# Patient Record
Sex: Female | Born: 1990 | Hispanic: Yes | Marital: Single | Smoking: Never smoker
Health system: Southern US, Community
[De-identification: ages and names within clinical notes are randomized; demographics above are authoritative.]

## PROBLEM LIST (undated history)

## (undated) ENCOUNTER — Inpatient Hospital Stay (HOSPITAL_COMMUNITY): Payer: Self-pay

## (undated) DIAGNOSIS — R51 Headache: Secondary | ICD-10-CM

## (undated) DIAGNOSIS — R519 Headache, unspecified: Secondary | ICD-10-CM

## (undated) HISTORY — DX: Headache, unspecified: R51.9

## (undated) HISTORY — PX: NO PAST SURGERIES: SHX2092

## (undated) HISTORY — DX: Headache: R51

---

## 2012-12-17 ENCOUNTER — Ambulatory Visit: Payer: Self-pay | Admitting: Physician Assistant

## 2012-12-17 VITALS — BP 111/67 | HR 101 | Temp 98.9°F | Resp 17 | Ht 64.5 in | Wt 112.0 lb

## 2012-12-17 DIAGNOSIS — R05 Cough: Secondary | ICD-10-CM

## 2012-12-17 DIAGNOSIS — J029 Acute pharyngitis, unspecified: Secondary | ICD-10-CM

## 2012-12-17 LAB — POCT RAPID STREP A (OFFICE): Rapid Strep A Screen: NEGATIVE

## 2012-12-17 MED ORDER — FIRST-DUKES MOUTHWASH MT SUSP: 10.0000 mL | OROMUCOSAL | Status: DC | PRN

## 2012-12-17 MED ORDER — BENZONATATE 100 MG PO CAPS: 100.0000 mg | ORAL_CAPSULE | Freq: Three times a day (TID) | ORAL | Status: DC | PRN

## 2012-12-17 NOTE — Patient Instructions (Signed)
Begin using the cough medicine three times per day.  Use the magic mouth wash every 2 hour as needed for sore throat.  You can take 600mg  ibuprofen every 8 hours for throat pain as well.  Use an over the counter allergy medicine (Zyrtec, Claritin, or Allegra) to help with runny nose.  Rest your voice.  Plenty of fluids.  If you are worsening or not improving, please let us know.   Viral Pharyngitis Viral pharyngitis is a viral infection that produces redness, pain, and swelling (inflammation) of the throat. It can spread from person to person (contagious). CAUSES Viral pharyngitis is caused by inhaling a large amount of certain germs called viruses. Many different viruses cause viral pharyngitis. SYMPTOMS Symptoms of viral pharyngitis include:  Sore throat.  Tiredness.  Stuffy nose.  Low-grade fever.  Congestion.  Cough. TREATMENT Treatment includes rest, drinking plenty of fluids, and the use of over-the-counter medication (approved by your caregiver). HOME CARE INSTRUCTIONS   Drink enough fluids to keep your urine clear or pale yellow.  Eat soft, cold foods such as ice cream, frozen ice pops, or gelatin dessert.  Gargle with warm salt water (1 tsp salt per 1 qt of water).  If over age 23, throat lozenges may be used safely.  Only take over-the-counter or prescription medicines for pain, discomfort, or fever as directed by your caregiver. Do not take aspirin. To help prevent spreading viral pharyngitis to others, avoid:  Mouth-to-mouth contact with others.  Sharing utensils for eating and drinking.  Coughing around others. SEEK MEDICAL CARE IF:   You are better in a few days, then become worse.  You have a fever or pain not helped by pain medicines.  There are any other changes that concern you. Document Released: 05/13/2005 Document Revised: 10/26/2011 Document Reviewed: 10/09/2010 [facility] Patient Information 2013 [place], [place].

## 2012-12-17 NOTE — Progress Notes (Signed)
  Subjective:    Patient ID: Andrea Caldwell, female    DOB: Jan 27, 1991, 22 y.o.   MRN: [id number]  HPI   Andrea Caldwell is a 22 yr old female here with concern for illness.  Pt does not speak english, a family member is assisting with the history.  Pt states she has had approx 5 days of sore throat.  She has lost her voice.  Additionally has some nasal drainage and non-productive cough.  Pt is coughing frequently, her back hurts when she coughs.  No ear pain, no GI symptoms, no fever.  Has been using Advil for symptoms.  Brother is sick with similar symptoms.     Review of Systems  Constitutional: Negative for fever and chills.  HENT: Positive for congestion, sore throat, rhinorrhea and voice change (hoarse). Negative for ear pain, neck pain and neck stiffness.   Respiratory: Positive for cough. Negative for shortness of breath and wheezing.   Cardiovascular: Negative.   Gastrointestinal: Negative.   Musculoskeletal: Positive for back pain (with cough).  Skin: Negative.   Neurological: Negative.        Objective:   Physical Exam  Vitals reviewed. Constitutional: She is oriented to person, place, and time. She appears well-developed and well-nourished. No distress.  HENT:  Head: Normocephalic and atraumatic.  Right Ear: Tympanic membrane and ear canal normal.  Left Ear: Tympanic membrane and ear canal normal.  Nose: Nose normal.  Mouth/Throat: Uvula is midline and mucous membranes are normal. Posterior oropharyngeal erythema present. No oropharyngeal exudate, posterior oropharyngeal edema or tonsillar abscesses.  Eyes: Conjunctivae are normal. No scleral icterus.  Neck: Neck supple.  Cardiovascular: Normal rate, regular rhythm and normal heart sounds.  Exam reveals no gallop and no friction rub.   No murmur heard. Pulmonary/Chest: Effort normal and breath sounds normal. She has no wheezes. She has no rales.  Abdominal: Soft. There is no tenderness.  Lymphadenopathy:    She has no cervical  adenopathy.  Neurological: She is alert and oriented to person, place, and time.  Skin: Skin is warm and dry.  Psychiatric: She has a normal mood and affect. Her behavior is normal.     Filed Vitals:   12/17/12 0840  BP: 111/67  Pulse: 101  Temp: 98.9 F (37.2 C)  Resp: 17     Results for orders placed in visit on 12/17/12  POCT RAPID STREP A (OFFICE)      Result Value Range   Rapid Strep A Screen Negative  Negative        Assessment & Plan:  Acute pharyngitis - Plan: POCT rapid strep A, Diphenhyd-Hydrocort-Nystatin (FIRST-DUKES MOUTHWASH) SUSP  Cough - Plan: benzonatate (TESSALON) 100 MG capsule   Andrea Caldwell is a 22 yr old female with pharyngitis and cough.  Suspect viral etiology.  Rapid strep is negative.  Exam is reassuring.  Tessalon TID for cough.  Magic mouthwash and ibuprofen for sore throat.  Encouraged voice rest to help with laryngitis.  OTC antihistamine for nasal symptoms.  Push fluids.  Rest.  If any symptoms worsening or not improving, pt will RTC.

## 2015-08-01 ENCOUNTER — Ambulatory Visit (INDEPENDENT_AMBULATORY_CARE_PROVIDER_SITE_OTHER): Payer: Self-pay | Admitting: Physician Assistant

## 2015-08-01 VITALS — BP 120/60 | HR 79 | Temp 98.8°F | Resp 18 | Ht 64.0 in | Wt 130.4 lb

## 2015-08-01 DIAGNOSIS — N912 Amenorrhea, unspecified: Secondary | ICD-10-CM

## 2015-08-01 DIAGNOSIS — Z23 Encounter for immunization: Secondary | ICD-10-CM

## 2015-08-01 DIAGNOSIS — Z3201 Encounter for pregnancy test, result positive: Secondary | ICD-10-CM

## 2015-08-01 LAB — POCT URINE PREGNANCY: Preg Test, Ur: POSITIVE — AB

## 2015-08-01 NOTE — Patient Instructions (Signed)
Visit [email or web address]  cido flico en el embarazo (Folic Acid in Pregnancy) El cido flico es una vitamina B que ayuda a prevenir la anomala congnita del tubo neural. El tubo neural es la parte de un feto en desarrollo que se convierte en el cerebro y la mdula espinal. Cuando el tubo neural no se cierra como corresponde, el beb nace con un defecto del tubo neural. Los defectos del tubo neural incluyen espina bfida, hernia de la mdula espinal y la ausencia de una parte o de todo el cerebro (anencefalia).  Tome cido flico al menos 4semanas antes de quedar embarazada y Energy Transfer Partnersdurante los primeros 3meses de Creswellembarazo, que es cuando se desarrolla el tubo neural. El cido flico est disponible en la mayora de las multivitaminas o como suplemento con cido flico solamente, y tambin lo contienen algunos alimentos. El consumo de la cantidad [place] May[place] Islandsadecuada de cido flico antes de la concepcin y durante el embarazo disminuye las probabilidades de Warehouse managertener un beb con un defecto del tubo neural. El hecho de tomar cido flico no influir en un defecto del tubo neural en el caso de que ya se haya desarrollado. DIAGNSTICO   Si el feto tiene un defecto del tubo neural, un anlisis de alfa fetoprotena (AFP) en la sangre o de lquido amnitico mostrar niveles altos de alfa fetoprotena. Este anlisis se hace a todas las Tax inspectorembarazadas en el primer trimestre.  Una ecografa puede detectar un defecto del tubo neural. LO QUE USTED PUEDE HACER:  Tome una multivitamina con al Lowe's Companiesmenos 0,354miligramos (400microgramos) de cido flico CarMaxtodos los das, al menos 4semanas antes de quedar embarazada y [place] Parkdurante las primeras 12semanas de Psychiatristembarazo.  Si ya ha tenido un beb con un defecto del tubo neural, tome 4miligramos (4000microgramos) de cido flico CarMaxtodos los das. Tome esta cantidad desde 1mes antes de intentar quedar [place]embarazada y [address] A[address] tomndola durante los primeros 3meses de [place] Villageembarazo. Si tiene trastorno convulsivo o toma  medicamentos para controlar las convulsiones, infrmeselo a su obstetra. Siga tomando el cido flico, excepto si le indican lo contrario.  CIDO FLICO EN LOS ALIMENTOS Siga una dieta saludable con alimentos que contengan cido flico, la forma natural de la vitamina. Algunos de estos alimentos son:  Cereales fortificados para el desayuno.  Lentejas.  Esprragos.  Espinaca.  Vsceras (hgado).  Frijoles negros.  Man (solo si no es Counselling psychologistalrgica).  Brcoli.  Aura CampsFresas, naranjas.  Jugo de naranja (es mejor el jugo concentrado).  Pastas y panes enriquecidos.  Cathleen FearsLechuga romana. CONSULTE A SU MDICO EN LOS SIGUIENTES CASOS:  Est en el primer trimestre de embarazo y tiene niveles altos de azcar en la sangre.  Est en el primer trimestre de embarazo y tiene fiebre alta. En casi todos los casos, un feto en el que se detecta un defecto del tubo neural necesitar atencin especializada, que puede no estar disponible en todos los hospitales. Hable con su mdico sobre lo que es mejor para usted y su beb.   Esta informacin no tiene Theme park managercomo fin reemplazar el consejo del mdico. Asegrese de hacerle al mdico cualquier pregunta que tenga.   Document Released: 05/24/2013 Document Revised: 08/24/2014 Elsevier Interactive Patient Education [employer]2016 Elsevier Inc.

## 2015-08-01 NOTE — Progress Notes (Signed)
   08/01/2015 6:12 PM   DOB: Sep 05, 1990 / MRN: [id number]  SUBJECTIVE:  Andrea BattyLaura Caldwell is a 24 y.o. female presenting for a "blood pregnancy test," at the advise of her sister.  Patient reports that she is two months later and has taken one OTC preg test which was positive.  She feels well tonight and has no physical complaints.    She has No Known Allergies.   She  has no past medical history on file.    She  reports that she has never smoked. She does not have any smokeless tobacco history on file. She  has no sexual activity history on file. The patient  has no past surgical history on file.  Her family history includes Cancer in her mother; Diabetes in her maternal grandmother; Heart disease in her father; Hyperlipidemia in her father.  Review of Systems  Constitutional: Negative for fever and chills.  Eyes: Negative for blurred vision.  Respiratory: Negative for cough and shortness of breath.   Cardiovascular: Negative for chest pain.  Gastrointestinal: Negative for nausea and abdominal pain.  Genitourinary: Negative for dysuria, urgency and frequency.  Musculoskeletal: Negative for myalgias.  Skin: Negative for rash.  Neurological: Negative for dizziness, tingling and headaches.  Psychiatric/Behavioral: Negative for depression. The patient is not nervous/anxious.     Problem list and medications reviewed and updated by myself where necessary, and exist elsewhere in the encounter.   OBJECTIVE:  BP 120/60 mmHg  Pulse 79  Temp(Src) 98.8 F (37.1 C) (Oral)  Resp 18  Ht 5\' 4"  (1.626 m)  Wt 130 lb 6.4 oz (59.149 kg)  BMI 22.37 kg/m2  SpO2 99%  LMP 06/14/2015 CrCl cannot be calculated (Patient has no serum creatinine result on file.).  Physical Exam  Constitutional: She is oriented to person, place, and time. She appears well-nourished. No distress.  Eyes: EOM are normal. Pupils are equal, round, and reactive to light.  Cardiovascular: Normal rate.   Pulmonary/Chest: Effort  normal.  Abdominal: She exhibits no distension.  Neurological: She is alert and oriented to person, place, and time. No cranial nerve deficit. Gait normal.  Skin: Skin is dry. She is not diaphoretic.  Psychiatric: She has a normal mood and affect.  Vitals reviewed.   Results for orders placed or performed in visit on 08/01/15 (from the past 48 hour(s))  POCT urine pregnancy     Status: Abnormal   Collection Time: 08/01/15  6:10 PM  Result Value Ref Range   Preg Test, Ur Positive (A) Negative    ASSESSMENT AND PLAN  Vernona RiegerLaura was seen today for other.  Diagnoses and all orders for this visit:  Amenorrhea: Pregnancy test positive with patient due on AUGUST 4TH 2017.  Will refer to OB/GYN for follow up in roughly 1 month.  Flu shot provided. Advised that she may only take tylenol for pain.  Advised that she consult the pharmacist for taking a prenatal and with regard to any other medication.   -     POCT urine pregnancy   The patient was advised to call or return to clinic if she does not see an improvement in symptoms or to seek the care of the closest emergency department if she worsens with the above plan.   Deliah BostonMichael Kein Carlberg, MHS, PA-C [facility] 08/01/2015 6:12 PM

## 2015-08-18 NOTE — L&D Delivery Note (Signed)
  Delivery Note At 2248 a viable malesex was delivered via  (Presentation:R ;OA  ).  APGAR: 9,9 ; weight - pending.  Placenta status: spontaneous ,intact . Delayed cord clamping  Cord: 3 vessel with the following complications: none .  Cord pH: n/a  Anesthesia: Epidural  Episiotomy: none Lacerations:  1st degree perineal Suture Repair: 3.0 monocryl Est. Blood Loss (mL):  350cc  Mom to postpartum.  Baby to Couplet care / Skin to Skin.  Palma HolterKanishka G Gunadasa, MD 02/27/2016, 11:01 PM  I was here for the above and performed the repair.  CRESENZO-DISHMAN,Breely Panik

## 2015-09-17 LAB — OB RESULTS CONSOLE HGB/HCT, BLOOD
HEMATOCRIT: 39 %
HEMOGLOBIN: 13.1 g/dL

## 2015-09-17 LAB — OB RESULTS CONSOLE ANTIBODY SCREEN: ANTIBODY SCREEN: NEGATIVE

## 2015-09-17 LAB — OB RESULTS CONSOLE GC/CHLAMYDIA
CHLAMYDIA, DNA PROBE: NEGATIVE
GC PROBE AMP, GENITAL: NEGATIVE

## 2015-09-17 LAB — OB RESULTS CONSOLE RUBELLA ANTIBODY, IGM: RUBELLA: IMMUNE

## 2015-09-17 LAB — OB RESULTS CONSOLE HEPATITIS B SURFACE ANTIGEN: Hepatitis B Surface Ag: NEGATIVE

## 2015-09-17 LAB — OB RESULTS CONSOLE ABO/RH: RH Type: POSITIVE

## 2015-09-17 LAB — OB RESULTS CONSOLE HIV ANTIBODY (ROUTINE TESTING): HIV: NONREACTIVE

## 2015-09-17 LAB — OB RESULTS CONSOLE PLATELET COUNT: PLATELETS: 320 10*3/uL

## 2015-12-13 LAB — OB RESULTS CONSOLE RPR: RPR: NONREACTIVE

## 2016-02-19 ENCOUNTER — Encounter: Payer: Self-pay | Admitting: Obstetrics

## 2016-02-21 ENCOUNTER — Encounter: Payer: Self-pay | Admitting: *Deleted

## 2016-02-24 ENCOUNTER — Ambulatory Visit (INDEPENDENT_AMBULATORY_CARE_PROVIDER_SITE_OTHER): Payer: Self-pay | Admitting: Obstetrics & Gynecology

## 2016-02-24 VITALS — BP 132/80 | HR 93 | Wt 157.0 lb

## 2016-02-24 DIAGNOSIS — Z3490 Encounter for supervision of normal pregnancy, unspecified, unspecified trimester: Secondary | ICD-10-CM | POA: Insufficient documentation

## 2016-02-24 DIAGNOSIS — Z3493 Encounter for supervision of normal pregnancy, unspecified, third trimester: Secondary | ICD-10-CM

## 2016-02-24 NOTE — Patient Instructions (Signed)
Regrese a la clinica cuando tenga su cita. Si tiene problemas o preguntas, llama a la clinica o vaya a la sala de emergencia al [facility].    

## 2016-02-24 NOTE — Progress Notes (Signed)
Needs 36 wk cultures

## 2016-02-24 NOTE — Progress Notes (Signed)
Subjective:  Andrea Caldwell is a [age] y[age] Cal[age].o. G1P0 at 3515w3d being seen today for ongoing prenatal care; was Dr. Elsie Caldwell's patient.  She is currently monitored for the following issues for this low-risk pregnancy and has Supervision of normal pregnancy on her problem list.  Patient reports no complaints.  Contractions: Not present. Vag. Bleeding: None.  Movement: Present. Denies leaking of fluid.   The following portions of the patient's history were reviewed and updated as appropriate: allergies, current medications, past family history, past medical history, past social history, past surgical history and problem list. Problem list updated.  Objective:   Filed Vitals:   02/24/16 0808  BP: 132/80  Pulse: 93  Weight: 157 lb (71.215 kg)    Fetal Status: Fetal Heart Rate (bpm): 140 Fundal Height: 37 cm Movement: Present  Presentation: Vertex  General:  Alert, oriented and cooperative. Patient is in no acute distress.  Skin: Skin is warm and dry. No rash noted.   Cardiovascular: Normal heart rate noted  Respiratory: Normal respiratory effort, no problems with respiration noted  Abdomen: Soft, gravid, appropriate for gestational age. Pain/Pressure: Present     Pelvic:  Cervical exam performed Dilation: 1 Effacement (%): 70 Station: -2  Extremities: Normal range of motion.  Edema: Trace  Mental Status: Normal mood and affect. Normal behavior. Normal judgment and thought content.   Urinalysis: Urine Protein: Negative Urine Glucose: Negative  Assessment and Plan:  Pregnancy: G1P0 at 1315w3d  Supervision of normal pregnancy, third trimester Pelvic cultures done today. - Strep Gp B NAA - GC/Chlamydia Probe Amp Preterm labor symptoms and general obstetric precautions including but not limited to vaginal bleeding, contractions, leaking of fluid and fetal movement were reviewed in detail with the patient. Please refer to After Visit Summary for other counseling recommendations.  Return in  about 1 week (around 03/02/2016) for OB Visit.   Andrea NewcomerUgonna A Anyanwu, MD

## 2016-02-25 ENCOUNTER — Other Ambulatory Visit: Payer: Self-pay

## 2016-02-25 ENCOUNTER — Telehealth: Payer: Self-pay | Admitting: Obstetrics

## 2016-02-25 DIAGNOSIS — Z113 Encounter for screening for infections with a predominantly sexual mode of transmission: Secondary | ICD-10-CM

## 2016-02-25 NOTE — Telephone Encounter (Signed)
Pt will came to [facility] to collect urine @12 :00 on 02/25/16 [facility] needs to collect pt's urine again due a [facility] accident/ Urine leaked in transit.   Andrea Caldwell

## 2016-02-26 ENCOUNTER — Inpatient Hospital Stay (HOSPITAL_COMMUNITY)
Admission: AD | Admit: 2016-02-26 | Discharge: 2016-02-29 | DRG: 775 | Disposition: A | Discharge status: Home / Self Care | Payer: Medicaid Other | Source: Ambulatory Visit | Attending: Obstetrics & Gynecology | Admitting: Obstetrics & Gynecology

## 2016-02-26 ENCOUNTER — Encounter (HOSPITAL_COMMUNITY): Payer: Self-pay

## 2016-02-26 DIAGNOSIS — O99824 Streptococcus B carrier state complicating childbirth: Secondary | ICD-10-CM | POA: Diagnosis present

## 2016-02-26 DIAGNOSIS — IMO0001 Reserved for inherently not codable concepts without codable children: Secondary | ICD-10-CM

## 2016-02-26 DIAGNOSIS — Z3493 Encounter for supervision of normal pregnancy, unspecified, third trimester: Secondary | ICD-10-CM

## 2016-02-26 DIAGNOSIS — O42013 Preterm premature rupture of membranes, onset of labor within 24 hours of rupture, third trimester: Principal | ICD-10-CM | POA: Diagnosis present

## 2016-02-26 DIAGNOSIS — Z3A36 36 weeks gestation of pregnancy: Secondary | ICD-10-CM | POA: Diagnosis not present

## 2016-02-26 DIAGNOSIS — O42919 Preterm premature rupture of membranes, unspecified as to length of time between rupture and onset of labor, unspecified trimester: Secondary | ICD-10-CM

## 2016-02-26 LAB — CBC
HCT: 39.1 % (ref 36.0–46.0)
Hemoglobin: 13.4 g/dL (ref 12.0–15.0)
MCH: 28.8 pg (ref 26.0–34.0)
MCHC: 34.3 g/dL (ref 30.0–36.0)
MCV: 83.9 fL (ref 78.0–100.0)
PLATELETS: 217 10*3/uL (ref 150–400)
RBC: 4.66 MIL/uL (ref 3.87–5.11)
RDW: 13.9 % (ref 11.5–15.5)
WBC: 11.2 10*3/uL — AB (ref 4.0–10.5)

## 2016-02-26 LAB — TYPE AND SCREEN
ABO/RH(D): A POS
ANTIBODY SCREEN: NEGATIVE

## 2016-02-26 LAB — STREP GP B NAA: STREP GROUP B AG: POSITIVE — AB

## 2016-02-26 LAB — ABO/RH: ABO/RH(D): A POS

## 2016-02-26 MED ORDER — OXYTOCIN 40 UNITS IN LACTATED RINGERS INFUSION - SIMPLE MED: 2.5000 [IU]/h | INTRAVENOUS | Status: DC

## 2016-02-26 MED ORDER — OXYCODONE-ACETAMINOPHEN 5-325 MG PO TABS: 2.0000 | ORAL_TABLET | ORAL | Status: DC | PRN

## 2016-02-26 MED ORDER — ONDANSETRON HCL 4 MG/2ML IJ SOLN: 4.0000 mg | Freq: Four times a day (QID) | INTRAMUSCULAR | Status: DC | PRN

## 2016-02-26 MED ORDER — PENICILLIN G POTASSIUM 5000000 UNITS IJ SOLR
2.5000 10*6.[IU] | INTRAMUSCULAR | Status: DC
  Administered 2016-02-26 – 2016-02-27 (×7): 2.5 10*6.[IU] via INTRAVENOUS
  Filled 2016-02-26 (×15): qty 2.5

## 2016-02-26 MED ORDER — ACETAMINOPHEN 325 MG PO TABS: 650.0000 mg | ORAL_TABLET | ORAL | Status: DC | PRN

## 2016-02-26 MED ORDER — OXYTOCIN BOLUS FROM INFUSION
500.0000 mL | INTRAVENOUS | Status: DC
  Administered 2016-02-27: 500 mL via INTRAVENOUS

## 2016-02-26 MED ORDER — FLEET ENEMA 7-19 GM/118ML RE ENEM: 1.0000 | ENEMA | RECTAL | Status: DC | PRN

## 2016-02-26 MED ORDER — LIDOCAINE HCL (PF) 1 % IJ SOLN
30.0000 mL | INTRAMUSCULAR | Status: DC | PRN
  Filled 2016-02-26: qty 30

## 2016-02-26 MED ORDER — SOD CITRATE-CITRIC ACID 500-334 MG/5ML PO SOLN: 30.0000 mL | ORAL | Status: DC | PRN

## 2016-02-26 MED ORDER — OXYCODONE-ACETAMINOPHEN 5-325 MG PO TABS: 1.0000 | ORAL_TABLET | ORAL | Status: DC | PRN

## 2016-02-26 MED ORDER — LACTATED RINGERS IV SOLN: 500.0000 mL | INTRAVENOUS | Status: DC | PRN

## 2016-02-26 MED ORDER — BETAMETHASONE SOD PHOS & ACET 6 (3-3) MG/ML IJ SUSP
12.0000 mg | Freq: Once | INTRAMUSCULAR | Status: AC
  Administered 2016-02-26: 12 mg via INTRAMUSCULAR
  Filled 2016-02-26: qty 2

## 2016-02-26 MED ORDER — PENICILLIN G POTASSIUM 5000000 UNITS IJ SOLR
5.0000 10*6.[IU] | Freq: Once | INTRAVENOUS | Status: AC
  Administered 2016-02-26: 5 10*6.[IU] via INTRAVENOUS
  Filled 2016-02-26: qty 5

## 2016-02-26 MED ORDER — LACTATED RINGERS IV SOLN
INTRAVENOUS | Status: DC
  Administered 2016-02-26 – 2016-02-27 (×4): via INTRAVENOUS

## 2016-02-26 NOTE — H&P (Signed)
Andrea FillerLaura Caldwell is a 25 y.o. female presenting for leaking of fluid at home.  Has a few mild contractions.  Has been followed by Dr Gaynell FaceMarshall.  Pregnancy has been uneventful.  RN Note: Patient presents with PROM at 1300. Patient states she had some brown discharge then a gush of fluid that was clear and has been leaking ever since. Fetus active.   Maternal Medical History:  Reason for admission: Rupture of membranes.  Nausea.  Contractions: Onset was 3-5 hours ago.   Frequency: irregular.   Perceived severity is mild.    Fetal activity: Perceived fetal activity is normal.   Last perceived fetal movement was within the past hour.    Prenatal complications: No bleeding, PIH, infection or preterm labor.   Prenatal Complications - Diabetes: none.    OB History    Gravida Para Term Preterm AB TAB SAB Ectopic Multiple Living   1              History reviewed. No pertinent past medical history. History reviewed. No pertinent past surgical history. Family History: family history includes Cancer in her mother; Diabetes in her maternal grandmother; Heart disease in her father; Hyperlipidemia in her father. Social History:  reports that she has never smoked. She does not have any smokeless tobacco history on file. Her alcohol and drug histories are not on file.   Prenatal Transfer Tool  Maternal Diabetes: No Genetic Screening: Normal Maternal Ultrasounds/Referrals: Normal Fetal Ultrasounds or other Referrals:  None Maternal Substance Abuse:  No Significant Maternal Medications:  None Significant Maternal Lab Results:  Lab values include: Group B Strep positive Other Comments:  None  Review of Systems  Constitutional: Negative for fever, chills and malaise/fatigue.  Eyes: Negative for blurred vision.  Gastrointestinal: Negative for nausea, vomiting, abdominal pain, diarrhea and constipation.  Genitourinary:       Leaking fluid  Neurological: Negative for dizziness.     Dilation: 1 Effacement (%): Thick Station: -3 Exam by:: Kayren EavesAshley Garvey RN  Blood pressure 136/86, pulse 91, temperature 99.1 F (37.3 C), temperature source Oral, resp. rate 18, last menstrual period 06/14/2015. Maternal Exam:  Uterine Assessment: Contraction strength is mild.  Contraction frequency is irregular.   Abdomen: Patient reports no abdominal tenderness. Fundal height is 36.   Estimated fetal weight is 6.   Fetal presentation: vertex  Introitus: Normal vulva. Vagina is positive for vaginal discharge.  Ferning test: positive.  Nitrazine test: not done. Amniotic fluid character: clear.  Pelvis: adequate for delivery.   Cervix: Cervix evaluated by digital exam.     Fetal Exam Fetal Monitor Review: Mode: ultrasound.   Baseline rate: 135.  Variability: moderate (6-25 bpm).   Pattern: accelerations present and no decelerations.    Fetal State Assessment: Category I - tracings are normal.     Physical Exam  Constitutional: She is oriented to person, place, and time. She appears well-developed and well-nourished. No distress.  HENT:  Head: Normocephalic.  Cardiovascular: Normal rate and regular rhythm.   Respiratory: Effort normal. No respiratory distress.  GI: Soft. She exhibits no distension. There is no tenderness. There is no rebound and no guarding.  Genitourinary: Vaginal discharge found.  Dilation: 1 Effacement (%): Thick Cervical Position: Posterior Station: -3 Exam by:: Kayren EavesAshley Garvey RN   Bedside US done by me for presentation Vertex confirmed   Musculoskeletal: Normal range of motion.  Neurological: She is alert and oriented to person, place, and time.  Skin: Skin is warm and dry.  Psychiatric: She  has a normal mood and affect.    Prenatal labs: ABO, Rh: A/Positive/-- (01/31 0000) Antibody: Negative (01/31 0000) Rubella: Immune (01/31 0000) RPR: Nonreactive (04/28 0000)  HBsAg: Negative (01/31 0000)  HIV: Non-reactive (01/31 0000)  GBS:  Positive (07/10 1630)   Assessment/Plan: SIUP at [redacted]w[redacted]d  PPROM Not in labor yet GBS positive  Ad[facility] to [facility] when bed available Routine orders Labor augmentation when able Penicillin prophylaxis   [facility] 02/26/2016, 5:56 PM

## 2016-02-26 NOTE — MAU Note (Signed)
Patient presents with PROM at 1300. Patient states she had some brown discharge then a gush of fluid that was clear and has been leaking ever since. Fetus active.

## 2016-02-26 NOTE — Progress Notes (Signed)
Updated Dr Despina HiddenEure.  Filed Vitals:   02/26/16 1624  BP: 136/86  Pulse: 91  Temp: 99.1 F (37.3 C)  TempSrc: Oral  Resp: 18    Still awaiting bed on Labor and Delivery.  FHR reassuring, monitoring intermittently   Dilation: 2 Effacement (%): 90 Cervical Position: Middle Station: -2 Presentation: Vertex Exam by:: Yoan Sallade CNM  Foley bulb inserted to begin the augmentation process.  Will continue to observe

## 2016-02-27 ENCOUNTER — Inpatient Hospital Stay (HOSPITAL_COMMUNITY): Payer: Medicaid Other | Admitting: Anesthesiology

## 2016-02-27 ENCOUNTER — Encounter (HOSPITAL_COMMUNITY): Payer: Self-pay | Admitting: *Deleted

## 2016-02-27 DIAGNOSIS — O42013 Preterm premature rupture of membranes, onset of labor within 24 hours of rupture, third trimester: Secondary | ICD-10-CM

## 2016-02-27 DIAGNOSIS — Z3A36 36 weeks gestation of pregnancy: Secondary | ICD-10-CM

## 2016-02-27 LAB — GC/CHLAMYDIA PROBE AMP
CHLAMYDIA, DNA PROBE: NEGATIVE
Chlamydia trachomatis, NAA: NEGATIVE
NEISSERIA GONORRHOEAE BY PCR: NEGATIVE
NEISSERIA GONORRHOEAE BY PCR: NEGATIVE

## 2016-02-27 LAB — RPR: RPR Ser Ql: NONREACTIVE

## 2016-02-27 LAB — OB RESULTS CONSOLE GBS: GBS: POSITIVE

## 2016-02-27 MED ORDER — LACTATED RINGERS IV SOLN: 500.0000 mL | Freq: Once | INTRAVENOUS | Status: DC

## 2016-02-27 MED ORDER — DIPHENHYDRAMINE HCL 50 MG/ML IJ SOLN: 12.5000 mg | INTRAMUSCULAR | Status: DC | PRN

## 2016-02-27 MED ORDER — TERBUTALINE SULFATE 1 MG/ML IJ SOLN
0.2500 mg | Freq: Once | INTRAMUSCULAR | Status: DC | PRN
  Filled 2016-02-27: qty 1

## 2016-02-27 MED ORDER — PHENYLEPHRINE 40 MCG/ML (10ML) SYRINGE FOR IV PUSH (FOR BLOOD PRESSURE SUPPORT)
80.0000 ug | PREFILLED_SYRINGE | INTRAVENOUS | Status: DC | PRN
  Filled 2016-02-27: qty 5

## 2016-02-27 MED ORDER — EPHEDRINE 5 MG/ML INJ
10.0000 mg | INTRAVENOUS | Status: DC | PRN
  Filled 2016-02-27: qty 2

## 2016-02-27 MED ORDER — FENTANYL 2.5 MCG/ML BUPIVACAINE 1/10 % EPIDURAL INFUSION (WH - ANES)
14.0000 mL/h | INTRAMUSCULAR | Status: DC | PRN
  Administered 2016-02-27: 14 mL/h via EPIDURAL
  Filled 2016-02-27: qty 125

## 2016-02-27 MED ORDER — BETAMETHASONE SOD PHOS & ACET 6 (3-3) MG/ML IJ SUSP
12.0000 mg | Freq: Once | INTRAMUSCULAR | Status: AC
  Administered 2016-02-27: 12 mg via INTRAMUSCULAR
  Filled 2016-02-27: qty 2

## 2016-02-27 MED ORDER — LIDOCAINE HCL (PF) 1 % IJ SOLN
INTRAMUSCULAR | Status: DC | PRN
  Administered 2016-02-27 (×2): 7 mL via EPIDURAL

## 2016-02-27 MED ORDER — PHENYLEPHRINE 40 MCG/ML (10ML) SYRINGE FOR IV PUSH (FOR BLOOD PRESSURE SUPPORT)
80.0000 ug | PREFILLED_SYRINGE | INTRAVENOUS | Status: DC | PRN
  Filled 2016-02-27: qty 5
  Filled 2016-02-27: qty 10

## 2016-02-27 MED ORDER — OXYTOCIN 40 UNITS IN LACTATED RINGERS INFUSION - SIMPLE MED
1.0000 m[IU]/min | INTRAVENOUS | Status: DC
  Administered 2016-02-27: 2 m[IU]/min via INTRAVENOUS
  Filled 2016-02-27: qty 1000

## 2016-02-27 NOTE — Progress Notes (Signed)
Labor Progress Note Andrea FillerLaura Caldwell is a 25 y.o. G1P0 at 7025w6d presented for PPROM 1300 7/12 S:  Doing well. Pushing with nursing. Epidural O:  BP 135/77 mmHg  Pulse 100  Temp(Src) 98.8 F (37.1 C) (Oral)  Resp 18  Ht 5' 4.17" (1.63 m)  Wt 71.215 kg (157 lb)  BMI 26.80 kg/m2  SpO2 100%  LMP 06/14/2015 EFM: 145/ mod variability/ 15x15  CVE: Dilation: 10 Dilation Complete Date: 02/27/16 Dilation Complete Time: 2034 Effacement (%): 100 Cervical Position: Middle Station: +1 Presentation: Vertex Exam by:: Malva CoganA. Schwarz RN    A&P: 25 y.o. G1P0 6425w6d active labor. Pushing  #Labor: pushing  #Pain: epidural #FWB: Cat 1 #GBS pos >PCN  Palma HolterKanishka G Gunadasa, MD 10:05 PM

## 2016-02-27 NOTE — Progress Notes (Signed)
I was present during the Epidural procedure, by Orlan LeavensViria Alvarez Spanish Interpreter.

## 2016-02-27 NOTE — Progress Notes (Signed)
LABOR PROGRESS NOTE  Andrea FillerLaura Caldwell is a 25 y.o. G1P0 at 6120w6d  admitted for PPROM.  Subjective: Pt resting comfortably.   Objective: BP 104/56 mmHg  Pulse 115  Temp(Src) 98.2 F (36.8 C) (Oral)  Resp 16  Ht 5' 4.17" (1.63 m)  Wt 71.215 kg (157 lb)  BMI 26.80 kg/m2  SpO2 98%  LMP 06/14/2015 or  Filed Vitals:   02/27/16 0721 02/27/16 0813 02/27/16 0912 02/27/16 1002  BP:  118/67 114/58 104/56  Pulse: 136 112 108 115  Temp:  98.2 F (36.8 C)    TempSrc:  Oral    Resp:      Height:      Weight:      SpO2:        Dilation: 2 Effacement (%): 90 Cervical Position: Middle Station: -2 Presentation: Vertex Exam by:: Williams CNM  Labs: Lab Results  Component Value Date   WBC 11.2* 02/26/2016   HGB 13.4 02/26/2016   HCT 39.1 02/26/2016   MCV 83.9 02/26/2016   PLT 217 02/26/2016    Patient Active Problem List   Diagnosis Date Noted  . Active labor at term 02/26/2016  . Supervision of normal pregnancy 02/24/2016    Assessment / Plan: 25 y.o. G1P0 at 4720w6d here for PPROM.  Labor: s/p foley out at 0700 Fetal Wellbeing:  Cat 1 Pain Control:  Epidural when pt requests Anticipated MOD:  SVD  Loni MuseKate Erle Guster, MD 02/27/2016, 10:50 AM

## 2016-02-27 NOTE — Progress Notes (Addendum)
Andrea Caldwell is a 25 y.o. G1P0 at 6487w6d admitted for induction of labor due to pprom.  Subjective: Pt uncomfortable with cramping, desires to eat before Pitocin started.   Objective: BP 106/54 mmHg  Pulse 111  Temp(Src) 98.7 F (37.1 C) (Oral)  Resp 20  Ht 5' 4.17" (1.63 m)  Wt 157 lb (71.215 kg)  BMI 26.80 kg/m2  SpO2 98%  LMP 06/14/2015      FHT:  FHR: 135 bpm, variability: moderate,  accelerations:  Present,  decelerations:  Absent UC:   irregular, every 2-10 minutes SVE:   Dilation: 5 Effacement (%): 90 Station: -1 Exam by:: Leftwitch-Kirby, CNM  Labs: Lab Results  Component Value Date   WBC 11.2* 02/26/2016   HGB 13.4 02/26/2016   HCT 39.1 02/26/2016   MCV 83.9 02/26/2016   PLT 217 02/26/2016    Assessment / Plan: Augmentation of labor, progressing well  Labor: Progressing normally.  Plan for pt to eat light meal, then start Pitocin in 1 hour. Preeclampsia:  n/a Fetal Wellbeing:  Category I Pain Control:  Labor support without medications I/D:  n/a Anticipated MOD:  NSVD  LEFTWICH-KIRBY, Andrea Caldwell 02/27/2016, 12:59 PM

## 2016-02-27 NOTE — Anesthesia Procedure Notes (Signed)
Epidural Patient location during procedure: OB Start time: 02/27/2016 3:41 PM End time: 02/27/2016 3:45 PM  Staffing Anesthesiologist: Leilani AbleHATCHETT, Cordae Mccarey Performed by: anesthesiologist   Preanesthetic Checklist Completed: patient identified, surgical consent, pre-op evaluation, timeout performed, IV checked, risks and benefits discussed and monitors and equipment checked  Epidural Patient position: sitting Prep: site prepped and draped and DuraPrep Patient monitoring: continuous pulse ox and blood pressure Approach: midline Location: L3-L4 Injection technique: LOR air  Needle:  Needle type: Tuohy  Needle gauge: 17 G Needle length: 9 cm and 9 Needle insertion depth: 5 cm cm Catheter type: closed end flexible Catheter size: 19 Gauge Catheter at skin depth: 10 cm Test dose: negative and Other  Assessment Sensory level: T9 Events: blood not aspirated, injection not painful, no injection resistance, negative IV test and no paresthesia  Additional Notes Reason for block:procedure for pain

## 2016-02-27 NOTE — Anesthesia Preprocedure Evaluation (Signed)
Anesthesia Evaluation  Patient identified by MRN, date of birth, ID band Patient awake    Reviewed: Allergy & Precautions, H&P , NPO status , Patient's Chart, lab work & pertinent test results  Airway Mallampati: I  TM Distance: >3 FB Neck ROM: full    Dental no notable dental hx.    Pulmonary neg pulmonary ROS,    Pulmonary exam normal breath sounds clear to auscultation       Cardiovascular negative cardio ROS Normal cardiovascular exam     Neuro/Psych negative neurological ROS  negative psych ROS   GI/Hepatic negative GI ROS, Neg liver ROS,   Endo/Other  negative endocrine ROS  Renal/GU negative Renal ROS     Musculoskeletal   Abdominal Normal abdominal exam  (+)   Peds  Hematology negative hematology ROS (+)   Anesthesia Other Findings   Reproductive/Obstetrics (+) Pregnancy                             Anesthesia Physical Anesthesia Plan  ASA: II  Anesthesia Plan: Epidural   Post-op Pain Management:    Induction:   Airway Management Planned:   Additional Equipment:   Intra-op Plan:   Post-operative Plan:   Informed Consent: I have reviewed the patients History and Physical, chart, labs and discussed the procedure including the risks, benefits and alternatives for the proposed anesthesia with the patient or authorized representative who has indicated his/her understanding and acceptance.     Plan Discussed with:   Anesthesia Plan Comments:         Anesthesia Quick Evaluation  

## 2016-02-27 NOTE — Anesthesia Pain Management Evaluation Note (Signed)
  [facility] Pain Management Visit Note  Patient: Axel FillerLaura Garcia-Espitia, 25 y.o., female  "Hello [facility] am a [facility]. We have an anesthesia team available at all times to provide care throughout the hospital, including epidural management and anesthesia for C-section. I don't know your plan for the delivery whether it a natural birth, water birth, IV sedation, nitrous supplementation, doula or epidural, but we want to meet your pain goals."   1.Was your pain managed to your expectations on prior hospitalizations?   No prior hospitalizations  2.What is your expectation for pain management during this hospitalization?     Epidural  3.How can we help you reach that goal? epidural  Record the patient's initial score and the patient's pain goal.   Pain: 5  Pain Goal: 7 [facility] wants you to be able to say your pain was always managed very well.  Dierks Wach 02/27/2016

## 2016-02-28 ENCOUNTER — Encounter (HOSPITAL_COMMUNITY): Payer: Self-pay | Admitting: General Practice

## 2016-02-28 DIAGNOSIS — O99824 Streptococcus B carrier state complicating childbirth: Secondary | ICD-10-CM

## 2016-02-28 DIAGNOSIS — O42013 Preterm premature rupture of membranes, onset of labor within 24 hours of rupture, third trimester: Secondary | ICD-10-CM

## 2016-02-28 DIAGNOSIS — Z3A36 36 weeks gestation of pregnancy: Secondary | ICD-10-CM

## 2016-02-28 MED ORDER — ONDANSETRON HCL 4 MG/2ML IJ SOLN: 4.0000 mg | INTRAMUSCULAR | Status: DC | PRN

## 2016-02-28 MED ORDER — DIPHENHYDRAMINE HCL 25 MG PO CAPS: 25.0000 mg | ORAL_CAPSULE | Freq: Four times a day (QID) | ORAL | Status: DC | PRN

## 2016-02-28 MED ORDER — MEASLES, MUMPS & RUBELLA VAC ~~LOC~~ INJ
0.5000 mL | INJECTION | Freq: Once | SUBCUTANEOUS | Status: DC
  Filled 2016-02-28: qty 0.5

## 2016-02-28 MED ORDER — FLEET ENEMA 7-19 GM/118ML RE ENEM: 1.0000 | ENEMA | Freq: Every day | RECTAL | Status: DC | PRN

## 2016-02-28 MED ORDER — METHYLERGONOVINE MALEATE 0.2 MG/ML IJ SOLN: 0.2000 mg | INTRAMUSCULAR | Status: DC | PRN

## 2016-02-28 MED ORDER — BENZOCAINE-MENTHOL 20-0.5 % EX AERO
1.0000 "application " | INHALATION_SPRAY | CUTANEOUS | Status: DC | PRN
  Administered 2016-02-28: 1 via TOPICAL
  Filled 2016-02-28: qty 56

## 2016-02-28 MED ORDER — FERROUS SULFATE 325 (65 FE) MG PO TABS
325.0000 mg | ORAL_TABLET | Freq: Two times a day (BID) | ORAL | Status: DC
  Administered 2016-02-28 – 2016-02-29 (×3): 325 mg via ORAL
  Filled 2016-02-28 (×3): qty 1

## 2016-02-28 MED ORDER — IBUPROFEN 600 MG PO TABS
600.0000 mg | ORAL_TABLET | Freq: Four times a day (QID) | ORAL | Status: DC
  Administered 2016-02-28 – 2016-02-29 (×6): 600 mg via ORAL
  Filled 2016-02-28 (×6): qty 1

## 2016-02-28 MED ORDER — DIBUCAINE 1 % RE OINT: 1.0000 "application " | TOPICAL_OINTMENT | RECTAL | Status: DC | PRN

## 2016-02-28 MED ORDER — COCONUT OIL OIL: 1.0000 "application " | TOPICAL_OIL | Status: DC | PRN

## 2016-02-28 MED ORDER — TETANUS-DIPHTH-ACELL PERTUSSIS 5-2.5-18.5 LF-MCG/0.5 IM SUSP: 0.5000 mL | Freq: Once | INTRAMUSCULAR | Status: DC

## 2016-02-28 MED ORDER — ZOLPIDEM TARTRATE 5 MG PO TABS: 5.0000 mg | ORAL_TABLET | Freq: Every evening | ORAL | Status: DC | PRN

## 2016-02-28 MED ORDER — BISACODYL 10 MG RE SUPP: 10.0000 mg | Freq: Every day | RECTAL | Status: DC | PRN

## 2016-02-28 MED ORDER — METHYLERGONOVINE MALEATE 0.2 MG PO TABS: 0.2000 mg | ORAL_TABLET | ORAL | Status: DC | PRN

## 2016-02-28 MED ORDER — DOCUSATE SODIUM 100 MG PO CAPS
100.0000 mg | ORAL_CAPSULE | Freq: Two times a day (BID) | ORAL | Status: DC
  Administered 2016-02-28 – 2016-02-29 (×3): 100 mg via ORAL
  Filled 2016-02-28 (×3): qty 1

## 2016-02-28 MED ORDER — PRENATAL MULTIVITAMIN CH
1.0000 | ORAL_TABLET | Freq: Every day | ORAL | Status: DC
  Administered 2016-02-28 – 2016-02-29 (×2): 1 via ORAL
  Filled 2016-02-28 (×2): qty 1

## 2016-02-28 MED ORDER — ACETAMINOPHEN 325 MG PO TABS: 650.0000 mg | ORAL_TABLET | ORAL | Status: DC | PRN

## 2016-02-28 MED ORDER — OXYCODONE HCL 5 MG PO TABS: 5.0000 mg | ORAL_TABLET | ORAL | Status: DC | PRN

## 2016-02-28 MED ORDER — WITCH HAZEL-GLYCERIN EX PADS
1.0000 "application " | MEDICATED_PAD | CUTANEOUS | Status: DC | PRN
  Administered 2016-02-29: 1 via TOPICAL

## 2016-02-28 MED ORDER — ONDANSETRON HCL 4 MG PO TABS: 4.0000 mg | ORAL_TABLET | ORAL | Status: DC | PRN

## 2016-02-28 MED ORDER — SIMETHICONE 80 MG PO CHEW: 80.0000 mg | CHEWABLE_TABLET | ORAL | Status: DC | PRN

## 2016-02-28 MED ORDER — OXYCODONE HCL 5 MG PO TABS: 10.0000 mg | ORAL_TABLET | ORAL | Status: DC | PRN

## 2016-02-28 NOTE — Lactation Note (Signed)
This note was copied from a baby's chart. Lactation Consultation Note:  assist mother with latching infant on the (R) breast. Mothers nipple are small, short and semi-flat. Multiple attempts to latch infant. Infant on and off for 10 mins. I fit mother with a #20 nipple shield. Infant latched with 8 ml of formula given through the nipple shield. Infant was then given a bottle by LC and FOB. Infant took 12 ml of formula. Mother assist with pumping breast. Lots of teaching and informed parents of plan of care. FOB interpret all teaching.  Parents have LPI parent instruction sheet. They deny having any questions. Mother to attempt to breastfeed without the shield Apply the shield properly if unable to get infant latched without Supplement infant according to guidelines every 2-3 hours.  Mother to pump after each feeding attempt.  Patient Name: Andrea Caldwell Reason for consult: Follow-up assessment   Maternal Data    Feeding Feeding Type: Formula Length of feed: 20 min  LATCH Score/Interventions Latch: Grasps breast easily, tongue down, lips flanged, rhythmical sucking. Intervention(s): Skin to skin;Teach feeding cues;Waking techniques  Audible Swallowing: A few with stimulation Intervention(s): Skin to skin;Hand expression  Type of Nipple: Flat (small semi-flat) Intervention(s): Shells;Double electric pump  Comfort (Breast/Nipple): Soft / non-tender     Hold (Positioning): Full assist, staff holds infant at breast Intervention(s): Breastfeeding basics reviewed;Support Pillows;Position options;Skin to skin  LATCH Score: 6  Lactation Tools Discussed/Used Tools: Nipple Shields Nipple shield size: 20   Consult Status Consult Status: Follow-up Date: 02/28/16 Follow-up type: In-patient    Andrea Caldwell, Andrea Caldwell Caldwell, 3:54 PM

## 2016-02-28 NOTE — Progress Notes (Signed)
Post Partum Day #1 Subjective: no complaints, up ad lib, voiding and tolerating PO. Breastfeeding: is having difficulties with latch.   Objective: Blood pressure 102/59, pulse 79, temperature 98.3 F (36.8 C), temperature source Oral, resp. rate 18, height 5' 4.17" (1.63 m), weight 157 lb (71.215 kg), last menstrual period 06/14/2015, SpO2 97 %, unknown if currently breastfeeding.  Physical Exam:  General: alert, cooperative and no distress Lochia: appropriate Uterine Fundus: firm Incision: none DVT Evaluation: No evidence of DVT seen on physical exam. No cords or calf tenderness. No significant calf/ankle edema.   Recent Labs  02/26/16 1735  HGB 13.4  HCT 39.1    Assessment/Plan: Plan for discharge tomorrow, Breastfeeding, Lactation consult and Contraception planning POP postpartum Needs lots of support with infant care.    LOS: 2 days   Andrea Caldwell, CNM 02/28/2016, 9:02 AM

## 2016-02-28 NOTE — Progress Notes (Signed)
[facility]. Care of pt relinquished to oncoming nurse.

## 2016-02-28 NOTE — Anesthesia Postprocedure Evaluation (Signed)
Anesthesia Post Note  Patient: Andrea FillerLaura Caldwell  Procedure(s) Performed: * No procedures listed *  Patient location during evaluation: Mother Baby Anesthesia Type: Epidural Level of consciousness: awake and alert Pain management: pain level controlled Vital Signs Assessment: post-procedure vital signs reviewed and stable Respiratory status: spontaneous breathing, nonlabored ventilation and respiratory function stable Cardiovascular status: stable Postop Assessment: no headache, no backache and epidural receding Anesthetic complications: no     Last Vitals:  Filed Vitals:   02/28/16 0525 02/28/16 0602  BP: 117/59 102/59  Pulse: 78 79  Temp: 36.8 C 36.8 C  Resp: 18 18    Last Pain:  Filed Vitals:   02/28/16 0606  PainSc: 2    Pain Goal: Patients Stated Pain Goal: 1 (02/28/16 0602)               Junious SilkGILBERT,Deegan Valentino

## 2016-02-28 NOTE — Progress Notes (Signed)
UR chart review completed.  

## 2016-02-28 NOTE — Progress Notes (Signed)
Post Partum Day 1  Subjective:  Axel FillerLaura Garcia-Espitia is a 25 y.o. G1P0101 5920w6d s/p SVD@2248 .  No acute events overnight.  Pt denies problems with ambulating, voiding or po intake.  She denies nausea or vomiting.  Pain is well controlled.  She has not had flatus. She has not had bowel movement.  Lochia Small.  Plan for birth control is undecided - thinking POPs.  Method of Feeding: breast  Objective: BP 102/59 mmHg  Pulse 79  Temp(Src) 98.3 F (36.8 C) (Oral)  Resp 18  Ht 5' 4.17" (1.63 m)  Wt 71.215 kg (157 lb)  BMI 26.80 kg/m2  SpO2 97%  LMP 06/14/2015  Breastfeeding? Unknown  Physical Exam:  General: alert, cooperative and no distress Lochia:normal flow Chest: CTAB Heart: RRR no m/r/g Abdomen: +BS, soft, nontender, fundus firm at/below umbilicus Uterine Fundus: firm, nontender DVT Evaluation: No evidence of DVT seen on physical exam. Extremities: no edema   Recent Labs  02/26/16 1735  HGB 13.4  HCT 39.1    Assessment/Plan:  ASSESSMENT: Axel FillerLaura Garcia-Espitia is a 25 y.o. G1P0101 4520w6d ppd #1 s/p NSVD doing well.   Plan for discharge tomorrow, Breastfeeding and Lactation consult   LOS: 2 days   Loni MuseKate Damarius Karnes 02/28/2016, 8:01 AM

## 2016-02-28 NOTE — Lactation Note (Signed)
This note was copied from a baby's chart. Lactation Consultation Note New mom w/very short shaft nipples that are almost flat. Breast feel heavy, w/edema? Has generalized edema to fingers. Mom has LOTS of questions. Interpreter present. Understands a lot of English, prefer to have interpreter present for teaching. Mom worried baby hasn't wanted to BF and feels she has no milk.  Educated about newborn behavior, feeding habits, I&O, STS encouraged, supply and demand. mom is breast/formula and plans to do that at home. Similac given. Formula supplementing feeding information sheet given.  [facility] Hand expression taught w/no colostrum noted. Mom stated see no milk. Explained consistency of colostrum, and mature milk in 3-5 days, importance of BF and stimulation to breast. Shells given to wear in bra to assist in everting nipples. Sister showed LC 2 bras to wear. Mom very tired, kept asking questions, but appeared to be to tired to understand. Encouraged to rest while baby was sleeping. Mom wanted to supplement before resting.  Patient Name: Andrea Axel FillerLaura Caldwell Mom encouraged to feed baby 8-12 times/24 hours and with feeding cues. Referred to Baby and Me Book in Breastfeeding section Pg. 22-23 for position options and Proper latch demonstration.[facility] brochure given w/resources, support groups and LC services. Today's Date: 02/28/2016 Reason for consult: Initial assessment   Maternal Data Has patient been taught Hand Expression?: Yes Does the patient have breastfeeding experience prior to this delivery?: No  Feeding Feeding Type: Formula Nipple Type: Slow - flow Length of feed: 0 min  LATCH Score/Interventions Latch: Too sleepy or reluctant, no latch achieved, no sucking elicited. Intervention(s): Skin to skin;Teach feeding cues;Waking techniques  Audible Swallowing: None Intervention(s): Skin to skin;Hand expression Intervention(s): Skin to skin  Type of Nipple: Everted at  rest and after stimulation (very short shaft)  Comfort (Breast/Nipple): Soft / non-tender     Hold (Positioning): Full assist, staff holds infant at breast Intervention(s): Breastfeeding basics reviewed;Support Pillows;Position options;Skin to skin  LATCH Score: 4  Lactation Tools Discussed/Used Tools: Shells;Pump Shell Type: Inverted Breast pump type: Manual Pump Review: Setup, frequency, and cleaning;Milk Storage Initiated by:: Peri JeffersonL. Tamy Accardo RN IBCLC Date initiated:: 02/28/16   Consult Status Consult Status: Follow-up Date: 02/28/16 Follow-up type: In-patient    Jaxson Keener, Diamond NickelLAURA G 02/28/2016, 3:04 AM

## 2016-02-29 MED ORDER — IBUPROFEN 600 MG PO TABS: 600.0000 mg | ORAL_TABLET | Freq: Four times a day (QID) | ORAL | Status: DC

## 2016-02-29 NOTE — Discharge Summary (Signed)
OB Discharge Summary Interpreter, Wallene HuhMarta, here for visit    Patient Name: Andrea Caldwell DOB: 1991-03-05 MRN: [id number]  Date of admission: 02/26/2016 Delivering MD: Palma HolterGUNADASA, KANISHKA G   Date of discharge: 02/29/2016  Admitting diagnosis: 36w water broke Intrauterine pregnancy: 6741w6d     Secondary diagnosis:  Active Problems:   Active labor at term  Additional problems: none     Discharge diagnosis: Term Pregnancy Delivered                                                                        PROM    NSVD                Post partum procedures:none  Augmentation: Pitocin  Complications: None  Hospital course:  Induction of Labor With Vaginal Delivery   25 y.o. yo G1P0101 at 2241w6d was admitted to the hospital 02/26/2016 for induction of labor.  Indication for induction: PROM.  Patient had an uncomplicated labor course as follows: Membrane Rupture Time/Date: 1:30 AM ,02/27/2016   Intrapartum Procedures: Episiotomy: None [1]                                         Lacerations:  1st degree [2]  Patient had delivery of a Viable infant.  Information for the patient's newborn:  Coralie KeensGarcia-Espitia, Boy [place] [[id number]][[id number]]  Delivery Method: Vaginal, Spontaneous Delivery (Filed from Delivery Summary)   02/27/2016  Details of delivery can be found in separate delivery note.  Patient had a routine postpartum course. Patient is discharged home 02/29/2016.   Physical exam  Filed Vitals:   02/28/16 0825 02/28/16 1407 02/28/16 1845 02/29/16 0516  BP: 94/64 113/61 129/72 111/73  Pulse: 88 86 73 75  Temp: 98.2 F (36.8 C) 98.2 F (36.8 C) 98.6 F (37 C) 98.6 F (37 C)  TempSrc: Oral Oral Oral Oral  Resp: 18 18 16 18   Height:      Weight:      SpO2:       General: alert, cooperative and no distress Lochia: appropriate Uterine Fundus: firm Incision: N/A DVT Evaluation: No evidence of DVT seen on physical exam. Labs: Lab Results  Component Value Date   WBC 11.2* 02/26/2016    HGB 13.4 02/26/2016   HCT 39.1 02/26/2016   MCV 83.9 02/26/2016   PLT 217 02/26/2016   No flowsheet data found.  Discharge instruction: per After Visit Summary and "Baby and Me Booklet".  After visit meds:    Medication List    TAKE these medications        ibuprofen 600 MG tablet  Commonly known as:  ADVIL,MOTRIN  Take 1 tablet (600 mg total) by mouth every 6 (six) hours.     prenatal multivitamin Tabs tablet  Take 1 tablet by mouth daily at 12 noon.        Diet: routine diet  Activity: Advance as tolerated. Pelvic rest for 6 weeks.   Outpatient follow up:2 weeks with Dr.Harper Follow up Appt:No future appointments. Follow up Visit:No Follow-up on file.  Postpartum contraception: Undecided, possibly OCPs. Information on LARC  Newborn Data: Live born female  Birth Weight: 6 lb 14.4 oz (3130 g) APGAR: 9, 9  Baby Feeding: Bottle and Breast Disposition:home with mother   02/29/2016 POE,DEIRDRE, CNM

## 2016-02-29 NOTE — Lactation Note (Signed)
This note was copied from a baby's chart. Lactation Consultation Note  FOB speaks english and assisted w/ interpretation. Baby latched with #20NS prefilled w/ formula.  Swallows observed w/ stimulation. FOB repeated prefilling.  After 10 min baby needed stimulation to continue feeding. Mother stated that she would like to pump and give baby formula. Reviewed engorgement care and monitoring voids/stools. Encouraged feeding baby at least every 3 hours.   Patient Name: Andrea Axel FillerLaura Caldwell EAVWU'JToday's Date: 02/29/2016 Reason for consult: Follow-up assessment   Maternal Data    Feeding Feeding Type: Breast Fed  LATCH Score/Interventions Latch: Grasps breast easily, tongue down, lips flanged, rhythmical sucking.  Audible Swallowing: A few with stimulation Intervention(s): Skin to skin  Type of Nipple: Flat Intervention(s): Double electric pump;Hand pump  Comfort (Breast/Nipple): Filling, red/small blisters or bruises, mild/mod discomfort  Problem noted: Mild/Moderate discomfort Interventions (Mild/moderate discomfort): Breast shields  Hold (Positioning): No assistance needed to correctly position infant at breast.  LATCH Score: 7  Lactation Tools Discussed/Used Tools: Nipple Shields Nipple shield size: 20 Breast pump type: Double-Electric Breast Pump   Consult Status Consult Status: Complete    Hardie PulleyBerkelhammer, Dakisha Schoof Boschen 02/29/2016, 9:49 AM

## 2016-02-29 NOTE — Discharge Instructions (Signed)
Elección del método anticonceptivo °(Contraception Choices) °La anticoncepción (control de la natalidad) es el uso de cualquier método o dispositivo para evitar el embarazo. A continuación se indican algunos de esos métodos. °MÉTODOS HORMONALES  °· El Implante contraconceptivo consiste en un tubo plástico delgado que contiene la hormona progesterona. No contiene estrógenos. El médico inserta el tubo en la parte interna del brazo. El tubo puede permanecer en el lugar durante 3 años. Después de los 3 años debe retirarse. El implante impide que los ovarios liberen óvulos (ovulación), espesa el moco cervical, lo que evita que los espermatozoides ingresen al útero y hace más delgada la membrana que cubre el interior del útero. °· Inyecciones de progesterona sola: las administra el médico cada 3 meses para evitar el embarazo. La progesterona sintética impide que los ovarios liberen óvulos. También hacen que el moco cervical se espese y modifique el tejido de recubrimiento interno del útero. Esto hace más difícil que los espermatozoides sobrevivan en el útero. °· Las píldoras anticonceptivas contienen estrógenos y progesterona. Su función es evitar que los ovarios liberen óvulos (ovulación). Las hormonas de los anticonceptivos orales hacen que el moco cervical se haga más espeso, lo que evita que el esperma ingrese al útero. Las píldoras anticonceptivas son recetadas por el médico. También se utilizan para tratar los períodos menstruales abundantes. °· Minipíldora: este tipo de píldora anticonceptiva contiene sólo hormona progesterona. Deben tomarse todos los días del mes y debe recetarlas el médico. °· El parche de control de natalidad: contiene hormonas similares a las que contienen las píldoras anticonceptivas. Deben cambiarse una vez por semana y se utilizan bajo prescripción médica. °· Anillo vaginal: contiene hormonas similares a las que contienen las píldoras anticonceptivas. Se deja colocado durante tres semanas,  se lo retira durante 1 semana y luego se coloca uno nuevo. La paciente debe sentirse cómoda al insertar y retirar el anillo de la vagina. Es necesaria la prescripción médica. °· Anticonceptivos de emergencia: son métodos para evitar un embarazo después de una relación sexual sin protección. Esta píldora puede tomarse inmediatamente después de tener relaciones sexuales o hasta 5 días de haber tenido sexo sin protección. Es más efectiva si se toma poco tiempo después de la relación sexual. Los anticonceptivos de emergencia están disponibles sin prescripción médica. Consúltelo con su farmacéutico. No use los anticonceptivos de emergencia como único método anticonceptivo. °MÉTODOS DE BARRERA  °· Condón masculino: es una vaina delgada (látex o goma) que se coloca cubriendo al pene durante el acto sexual. Puede usarse con espermicida para aumentar la efectividad. °· Condón femenino. Es una funda delicada y blanda que se adapta holgadamente a la vagina antes de las relaciones sexuales. °· Diafragma: es una barrera de látex redonda y suave que debe ser recomendado por un profesional. Se inserta en la vagina, junto con un gel espermicida. Debe insertarse antes de tener relaciones sexuales. Debe dejar el diafragma colocado en la vagina durante 6 a 8 horas después de la relación sexual. °· Capuchón cervical: es una barrera de látex o taza plástica redonda y suave que cubre el cuello del útero y debe ser colocada por un médico. Puede dejarlo colocado en la vagina hasta 48 horas después de las relaciones sexuales. °· Esponja: es una pieza blanda y circular de espuma de poliuretano. Contiene un espermicida. Se inserta en la vagina después de mojarla y antes de las relaciones sexuales. °· Espermicidas: son sustancias químicas que matan o bloquean al esperma y no lo dejan ingresar al cuello del útero y al útero. Vienen   en forma de cremas, geles, supositorios, espuma o comprimidos. No es necesario tener receta médica. Se insertan en  la vagina con un aplicador antes de tener relaciones sexuales. El proceso debe repetirse cada vez que tiene relaciones sexuales. °ANTICONCEPTIVOS INTRAUTERINOS °· Dispositivo intrauterino (DIU) es un dispositivo en forma de T que se coloca en el útero durante el período menstrual, para evitar el embarazo. Hay dos tipos: °¨ DIU de cobre: este tipo de DIU está recubierto con un alambre de cobre y se inserta dentro del útero. El cobre hace que el útero y las trompas de Falopio produzcan un liquido que destruye los espermatozoides. Puede permanecer colocado durante 10 años. °¨ DIU con hormona: este tipo de DIU contiene la hormona progestina (progesterona sintética). La hormona espesa el moco cervical y evita que los espermatozoides ingresen al útero y también afina la membrana que cubre el útero para evitar la implantación del óvulo fertilizado. La hormona debilita o destruye los espermatozoides que ingresan al útero. Puede permanecer en el lugar durante 3-5 años, según el tipo de DIU que se utilice. °MÉTODOS ANTICONCEPTIVOS PERMANENTES °· Ligadura de trompas en la mujer: se realiza sellando, atando u obstruyendo quirúrgicamente las trompas de Falopio lo que impide que el óvulo descienda hacia el útero. °· Esterilización histeroscópica: Implica la colocación de un pequeño espiral o la inserción en cada trompa de Falopio. El médico utiliza una técnica llamada histeroscopía para realizar este procedimiento. El dispositivo produce la formación de tejido cicatrizal. Esto da como resultado una obstrucción permanente de las trompas de Falopio, de modo que la esperma no pueda fertilizar el óvulo. Demora alrededor de 3 meses después del procedimiento hasta que el conducto se obstruye. Tendrá que usar otro método anticonceptivo durante al menos 3 meses. °· Esterilización masculina: se realiza ligando los conductos por los que pasan los espermatozoides (vasectomía).Esto impide que el esperma ingrese a la vagina durante el acto  sexual. Luego del procedimiento, el hombre puede eyacular líquido (semen). °MÉTODOS DE PLANIFICACIÓN NATURAL °· Planificación familiar natural: consiste en no tener relaciones sexuales o usar un método de barrera (condón, diafragma, capuchón cervical) en los días que la mujer podría quedar embarazada. °· Método de calendario: consiste en el seguimiento de la duración de cada ciclo menstrual y la identificación de los períodos fértiles. °· Método de ovulación: consiste en evitar las relaciones sexuales durante la ovulación. °· Método sintotérmico: consiste en evitar las relaciones sexuales en la época en la que se está ovulando, utilizando un termómetro y tendiendo en cuenta los síntomas de la ovulación. °· Método postovulación: consiste en planificar las relaciones sexuales para después de haber ovulado. °Independientemente del tipo o método anticonceptivo que usted elija, es importante que use condones para protegerse contra las infecciones de transmisión sexual (ETS). Hable con su médico con respecto a qué método anticonceptivo es el más apropiado para usted. °  °Esta información no tiene como fin reemplazar el consejo del médico. Asegúrese de hacerle al médico cualquier pregunta que tenga. °  °Document Released: 08/03/2005 Document Revised: 04/05/2013 °Elsevier Interactive Patient Education ©2016 [employer] ° °Cuidados en el postparto luego de un parto vaginal  °(Postpartum Care After Vaginal Delivery) °Después del parto (período de postparto), la estadía normal en el hospital es de 24-72 horas. Si hubo problemas con el trabajo de parto o el parto, o si tiene otros problemas médicos, es posible que deba permanecer en el hospital por más tiempo.  °Mientras esté en el hospital, recibirá ayuda e instrucciones sobre cómo cuidar de usted   misma y de su bebé recién nacido durante el postparto.  °Mientras esté en el hospital:  °· Asegúrese de decirle a las enfermeras si siente dolor o malestar, así como donde siente  el dolor y qué empeora el dolor. °· Si usted tuvo una incisión cerca de la vagina (episiotomía) o si ha tenido algún desgarro durante el parto, las enfermeras le pondrán hielo sobre la episiotomía o el desgarro. Las bolsas de hielo pueden ayudar a reducir el dolor y la hinchazón. °· Si está amamantando, puede sentir contracciones dolorosas en el útero durante algunas semanas. Esto es normal. Las contracciones ayudan a que el útero vuelva a su tamaño normal. °· Es normal tener algo de sangrado después del parto. °· Durante los primeros 1-3 días después del parto, el flujo es de color rojo y la cantidad puede ser similar a un período. °· Es frecuente que el flujo se inicie y se detenga. °· En los primeros días, puede eliminar algunos coágulos pequeños. Informe a las enfermeras si elimina coágulos grandes o aumenta el flujo. °· No  elimine los coágulos de sangre por el inodoro antes de que la enfermera los vea. °· Durante los próximos 3 a 10 días después del parto, el flujo debe ser más acuoso y rosado o marrón. °· De diez a catorce días después del parto, el flujo debe ser una pequeña cantidad de secreción de color blanco amarillento. °· La cantidad de flujo disminuirá en las primeras semanas después del parto. El flujo puede detenerse en 6-8 semanas. La mayoría de las mujeres no tienen más flujo a las 12 semanas después del parto. °· Usted debe cambiar sus apósitos con frecuencia. °· Lávese bien las manos con agua y jabón durante al menos 20 segundos después de cambiar el apósito, usar el baño o antes de sostener o alimentar a su recién nacido. °· Usted podrá sentir como que tiene que vaciar la vejiga durante las primeras 6-8 horas después del parto. °· En caso de que sienta debilidad, mareo o desmayo, llame a la enfermera antes de levantarse de la cama por primera vez y antes de tomar una ducha por primera vez. °· Dentro de los primeros días después del parto, sus mamas pueden comenzar a estar sensibles y llenas.  Esto se llama congestión. La sensibilidad en los senos por lo general desaparece dentro de las 48-72 horas después de que ocurre la congestión. También puede notar que la leche se escapa de sus senos. Si no está amamantando no estimule sus pechos. La estimulación de las mamas hace que sus senos produzcan más leche. °· Pasar tanto tiempo como le sea posible con el bebé recién nacido es muy importante. Durante ese tiempo, usted y su bebé deben sentirse cerca y conocerse uno al otro. Tener al bebé en su habitación (alojamiento conjunto) ayudará a fortalecer el vínculo con el bebé recién nacido. Esto le dará tiempo para conocerlo y atenderlo de manera cómoda. °· Las hormonas se modifican después del parto. A veces, los cambios hormonales pueden causar tristeza o ganas de llorar por un tiempo. Estos sentimientos no deben durar más de unos pocos días. Si duran más que eso, debe hablar con su médico. °· Si lo desea, hable con su médico acerca de los métodos de planificación familiar o métodos anticonceptivos. °· Hable con su médico acerca de las vacunas. El médico puede indicarle que se aplique las siguientes vacunas antes de salir del hospital: °· Vacuna contra el tétanos, la difteria y la tos ferina (Tdap) o   el tétanos y la difteria (Td). Es muy importante que usted y su familia (incluyendo a los abuelos) u otras personas que cuidan al recién nacido estén al día con las vacunas Tdap o Td. Las vacunas Tdap o Td pueden ayudar a proteger al recién nacido de enfermedades. °· Inmunización contra la rubéola. °· Inmunización contra la varicela. °· Inmunización contra la gripe. Usted debe recibir esta vacunación anual si no la ha recibido durante el embarazo. °  °Esta información no tiene como fin reemplazar el consejo del médico. Asegúrese de hacerle al médico cualquier pregunta que tenga. °  °Document Released: 05/31/2007 Document Revised: 04/27/2012 °Elsevier Interactive Patient Education ©2016 Elsevier Inc. ° °

## 2016-02-29 NOTE — Lactation Note (Signed)
This note was copied from a baby's chart. Lactation Consultation Note  Spanish interpreter present. Mother states baby breastfed last night two times with improved latch but then they decided to give formula during the night. Discussed importance of establishing milk supply, supply and demand.  Breastfeed before offering formula. Reviewed engorgement care and monitoring voids/stools. Suggest parents call for LC to view next feeding. Mother is using #20NS to breastfeed.  Provided her with extra for going home.  Patient Name: [name] [id number]'s Date: 02/29/2016 Reason for consult: Follow-up assessment   Maternal Data    Feeding Feeding Type: Bottle Fed - Formula  LATCH Score/Interventions                      Lactation Tools Discussed/Used     Consult Status Consult Status: Complete    Hardie PulleyBerkelhammer, Ruth Boschen 02/29/2016, 8:53 AM

## 2016-03-01 ENCOUNTER — Ambulatory Visit: Payer: Self-pay

## 2016-03-01 NOTE — Lactation Note (Signed)
This note was copied from a baby's chart. Lactation Consultation Note  Trinna Postlex spanish interpreter present. Mother's breasts are engorged.  Mother has been giving formula through the night and not pumping breasts on a regular basis. Reminded mother that if baby is not going to the breast, then she will need to pump q3 both breasts if she wants to provide breast milk to her baby. Explained supply and demand.  Brought mother ice packs for both breasts.  Explained she needs to massage breast to empty during bf and pumping. Mother began pumping one breast massaging to empty.  Recommend placing ice on breast for 15-20 every 2-3 hours.  Frozen vegetables can be used at home. Mother states she plans to buy DEBP today.  Discussed continuing to use NS and post pump - give volume back to baby at next feeding. Last pumping session mother was able to express 15 ml.    Patient Name: Andrea Axel FillerLaura Caldwell [id number]'s Date: 03/01/2016 Reason for consult: Follow-up assessment   Maternal Data    Feeding    LATCH Score/Interventions                      Lactation Tools Discussed/Used     Consult Status Consult Status: Complete    Hardie PulleyBerkelhammer, Ruth Boschen 03/01/2016, 8:35 AM

## 2016-03-02 ENCOUNTER — Encounter: Payer: Self-pay | Admitting: Obstetrics & Gynecology

## 2016-03-05 ENCOUNTER — Encounter: Payer: Self-pay | Admitting: Obstetrics

## 2016-03-19 ENCOUNTER — Ambulatory Visit (INDEPENDENT_AMBULATORY_CARE_PROVIDER_SITE_OTHER): Payer: Self-pay | Admitting: Obstetrics

## 2016-03-19 DIAGNOSIS — Z3009 Encounter for other general counseling and advice on contraception: Secondary | ICD-10-CM

## 2016-03-23 ENCOUNTER — Encounter: Payer: Self-pay | Admitting: Obstetrics

## 2016-03-23 NOTE — Progress Notes (Signed)
Subjective:     Axel FillerLaura Garcia-Espitia is a 25 y.o. female who presents for a postpartum visit. She is 2 weeks postpartum following a spontaneous vaginal delivery. I have fully reviewed the prenatal and intrapartum course. The delivery was at 36 gestational weeks. Outcome: spontaneous vaginal delivery. Anesthesia: epidural. Postpartum course has been normal. Baby's course has been normal. Baby is feeding by breast. Bleeding thin lochia. Bowel function is normal. Bladder function is normal. Patient is not sexually active. Contraception method is abstinence. Postpartum depression screening: negative.  Tobacco, alcohol and substance abuse history reviewed.  Adult immunizations reviewed including TDAP, rubella and varicella.  The following portions of the patient's history were reviewed and updated as appropriate: allergies, current medications, past family history, past medical history, past social history, past surgical history and problem list.  Review of Systems A comprehensive review of systems was negative.   Objective:    BP 107/62   Pulse 86   Temp 99 F (37.2 C) (Oral)   Wt 136 lb 9.6 oz (62 kg)   LMP 06/14/2015   BMI 23.32 kg/m    PE:  Deferred   100% of 10 min visit spent on counseling and coordination of care.   Assessment:    2 weeks postpartum.  Doing well.  Contraceptive counseling and advice.  Plan:    1. Contraception: Considering options 2. Continue PNV's 3. Follow up in: 4 weeks  Healthy lifestyle practices reviewed

## 2016-03-25 ENCOUNTER — Ambulatory Visit: Payer: Self-pay | Admitting: Obstetrics

## 2016-04-11 ENCOUNTER — Other Ambulatory Visit: Payer: Self-pay | Admitting: Obstetrics

## 2016-04-16 ENCOUNTER — Ambulatory Visit (INDEPENDENT_AMBULATORY_CARE_PROVIDER_SITE_OTHER): Payer: Self-pay | Admitting: Obstetrics

## 2016-04-16 ENCOUNTER — Ambulatory Visit: Payer: Self-pay | Admitting: Obstetrics

## 2016-04-16 ENCOUNTER — Encounter: Payer: Self-pay | Admitting: *Deleted

## 2016-04-16 ENCOUNTER — Other Ambulatory Visit: Payer: Self-pay | Admitting: Obstetrics

## 2016-04-16 ENCOUNTER — Encounter: Payer: Self-pay | Admitting: Obstetrics

## 2016-04-16 DIAGNOSIS — Z30011 Encounter for initial prescription of contraceptive pills: Secondary | ICD-10-CM

## 2016-04-16 LAB — POCT URINALYSIS DIPSTICK
BILIRUBIN UA: NEGATIVE
Blood, UA: NEGATIVE
Glucose, UA: NEGATIVE
KETONES UA: NEGATIVE
Nitrite, UA: POSITIVE
Protein, UA: NEGATIVE
Spec Grav, UA: 1.02
Urobilinogen, UA: 0.2
pH, UA: 5

## 2016-04-16 MED ORDER — NORETHINDRONE 0.35 MG PO TABS: 1.0000 | ORAL_TABLET | Freq: Every day | ORAL | 11 refills | Status: DC

## 2016-04-16 NOTE — Progress Notes (Signed)
Subjective:     Axel FillerLaura Caldwell is a 25 y.o. female who presents for a postpartum visit. She is 6 weeks postpartum following a spontaneous vaginal delivery. I have fully reviewed the prenatal and intrapartum course. The delivery was at 36 gestational weeks. Outcome: spontaneous vaginal delivery. Anesthesia: epidural. Postpartum course has been normal. Baby's course has been normal. Baby is feeding by both breast and bottle - Similac Advance. Bleeding no bleeding. Bowel function is normal. Bladder function is normal. Patient is not sexually active. Contraception method is abstinence. Postpartum depression screening: negative.  Tobacco, alcohol and substance abuse history reviewed.  Adult immunizations reviewed including TDAP, rubella and varicella.  The following portions of the patient's history were reviewed and updated as appropriate: allergies, current medications, past family history, past medical history, past social history, past surgical history and problem list.  Review of Systems A comprehensive review of systems was negative.   Objective:    BP 116/74   Pulse 69   Temp 98.5 F (36.9 C)   Wt 134 lb 3.2 oz (60.9 kg)   Breastfeeding? Yes   BMI 22.91 kg/m   General:  alert and no distress   Breasts:  inspection negative, no nipple discharge or bleeding, no masses or nodularity palpable  Lungs: clear to auscultation bilaterally  Heart:  regular rate and rhythm, S1, S2 normal, no murmur, click, rub or gallop  Abdomen: soft, non-tender; bowel sounds normal; no masses,  no organomegaly   Vulva:  normal  Vagina: normal vagina  Cervix:  no cervical motion tenderness  Corpus: normal size, contour, position, consistency, mobility, non-tender  Adnexa:  no mass, fullness, tenderness  Rectal Exam: Not performed.          50% of 20 min visit spent on counseling and coordination of care.   Assessment:     Normal postpartum exam. Pap smear not done at today's visit.    Plan:    1. Contraception: oral progesterone-only contraceptive 2. Micronor Rx 3. Follow up in: several months or as needed.  2hr GTT for h/o GDM/screening for DM q 3 yrs per ADA recommendations Preconception counseling provided Healthy lifestyle practices reviewed

## 2016-04-17 ENCOUNTER — Other Ambulatory Visit: Payer: Self-pay | Admitting: Obstetrics

## 2016-04-17 DIAGNOSIS — N390 Urinary tract infection, site not specified: Secondary | ICD-10-CM

## 2016-04-17 MED ORDER — NITROFURANTOIN MONOHYD MACRO 100 MG PO CAPS: 100.0000 mg | ORAL_CAPSULE | Freq: Two times a day (BID) | ORAL | 2 refills | Status: DC

## 2016-04-19 LAB — CULTURE, URINE COMPREHENSIVE

## 2016-04-21 ENCOUNTER — Other Ambulatory Visit: Payer: Self-pay | Admitting: Obstetrics

## 2016-04-21 LAB — NUSWAB VG, CANDIDA 6SP
CANDIDA ALBICANS, NAA: NEGATIVE
CANDIDA LUSITANIAE, NAA: NEGATIVE
CANDIDA PARAPSILOSIS, NAA: NEGATIVE
CANDIDA TROPICALIS, NAA: NEGATIVE
Candida glabrata, NAA: NEGATIVE
Candida krusei, NAA: NEGATIVE
Trich vag by NAA: NEGATIVE

## 2017-02-12 ENCOUNTER — Ambulatory Visit (INDEPENDENT_AMBULATORY_CARE_PROVIDER_SITE_OTHER): Payer: Self-pay | Admitting: Urgent Care

## 2017-02-12 ENCOUNTER — Inpatient Hospital Stay (HOSPITAL_COMMUNITY)
Admission: AD | Admit: 2017-02-12 | Discharge: 2017-02-12 | Disposition: A | Discharge status: Home / Self Care | Payer: Self-pay | Source: Ambulatory Visit | Attending: Obstetrics and Gynecology | Admitting: Obstetrics and Gynecology

## 2017-02-12 ENCOUNTER — Inpatient Hospital Stay (HOSPITAL_COMMUNITY): Payer: Self-pay

## 2017-02-12 ENCOUNTER — Encounter (HOSPITAL_COMMUNITY): Payer: Self-pay | Admitting: Student

## 2017-02-12 DIAGNOSIS — O26899 Other specified pregnancy related conditions, unspecified trimester: Secondary | ICD-10-CM

## 2017-02-12 DIAGNOSIS — R109 Unspecified abdominal pain: Secondary | ICD-10-CM

## 2017-02-12 DIAGNOSIS — O98811 Other maternal infectious and parasitic diseases complicating pregnancy, first trimester: Secondary | ICD-10-CM | POA: Insufficient documentation

## 2017-02-12 DIAGNOSIS — Z3491 Encounter for supervision of normal pregnancy, unspecified, first trimester: Secondary | ICD-10-CM

## 2017-02-12 DIAGNOSIS — N898 Other specified noninflammatory disorders of vagina: Secondary | ICD-10-CM

## 2017-02-12 DIAGNOSIS — N926 Irregular menstruation, unspecified: Secondary | ICD-10-CM

## 2017-02-12 DIAGNOSIS — B3731 Acute candidiasis of vulva and vagina: Secondary | ICD-10-CM

## 2017-02-12 DIAGNOSIS — R102 Pelvic and perineal pain: Secondary | ICD-10-CM

## 2017-02-12 DIAGNOSIS — Z3A09 9 weeks gestation of pregnancy: Secondary | ICD-10-CM | POA: Insufficient documentation

## 2017-02-12 DIAGNOSIS — O9989 Other specified diseases and conditions complicating pregnancy, childbirth and the puerperium: Secondary | ICD-10-CM

## 2017-02-12 DIAGNOSIS — R300 Dysuria: Secondary | ICD-10-CM

## 2017-02-12 DIAGNOSIS — Z3490 Encounter for supervision of normal pregnancy, unspecified, unspecified trimester: Secondary | ICD-10-CM

## 2017-02-12 DIAGNOSIS — B373 Candidiasis of vulva and vagina: Secondary | ICD-10-CM | POA: Insufficient documentation

## 2017-02-12 DIAGNOSIS — O26891 Other specified pregnancy related conditions, first trimester: Secondary | ICD-10-CM | POA: Insufficient documentation

## 2017-02-12 DIAGNOSIS — L298 Other pruritus: Secondary | ICD-10-CM

## 2017-02-12 LAB — CBC
HEMATOCRIT: 39.8 % (ref 36.0–46.0)
HEMOGLOBIN: 13.6 g/dL (ref 12.0–15.0)
MCH: 28 pg (ref 26.0–34.0)
MCHC: 34.2 g/dL (ref 30.0–36.0)
MCV: 81.9 fL (ref 78.0–100.0)
Platelets: 330 10*3/uL (ref 150–400)
RBC: 4.86 MIL/uL (ref 3.87–5.11)
RDW: 13 % (ref 11.5–15.5)
WBC: 11.4 10*3/uL — AB (ref 4.0–10.5)

## 2017-02-12 LAB — POCT URINALYSIS DIP (MANUAL ENTRY)
Bilirubin, UA: NEGATIVE
GLUCOSE UA: NEGATIVE mg/dL
Ketones, POC UA: NEGATIVE mg/dL
Leukocytes, UA: NEGATIVE
NITRITE UA: NEGATIVE
Protein Ur, POC: 30 mg/dL — AB
RBC UA: NEGATIVE
Spec Grav, UA: 1.015 (ref 1.010–1.025)
UROBILINOGEN UA: 0.2 U/dL
pH, UA: 8.5 — AB (ref 5.0–8.0)

## 2017-02-12 LAB — WET PREP, GENITAL
Clue Cells Wet Prep HPF POC: NONE SEEN
SPERM: NONE SEEN
Trich, Wet Prep: NONE SEEN
Yeast Wet Prep HPF POC: NONE SEEN

## 2017-02-12 LAB — URINALYSIS, ROUTINE W REFLEX MICROSCOPIC
BILIRUBIN URINE: NEGATIVE
GLUCOSE, UA: NEGATIVE mg/dL
HGB URINE DIPSTICK: NEGATIVE
Ketones, ur: NEGATIVE mg/dL
Leukocytes, UA: NEGATIVE
Nitrite: NEGATIVE
PH: 7 (ref 5.0–8.0)
Protein, ur: NEGATIVE mg/dL
SPECIFIC GRAVITY, URINE: 1.006 (ref 1.005–1.030)

## 2017-02-12 LAB — POC MICROSCOPIC URINALYSIS (UMFC): Mucus: ABSENT

## 2017-02-12 LAB — POCT URINE PREGNANCY: PREG TEST UR: POSITIVE — AB

## 2017-02-12 LAB — HCG, QUANTITATIVE, PREGNANCY: hCG, Beta Chain, Quant, S: 148255 m[IU]/mL — ABNORMAL HIGH (ref <5)

## 2017-02-12 MED ORDER — TERCONAZOLE 0.8 % VA CREA: 1.0000 | TOPICAL_CREAM | Freq: Every day | VAGINAL | 0 refills | Status: DC

## 2017-02-12 NOTE — MAU Provider Note (Signed)
History     CSN: [id number]  Arrival date and time: 02/12/17 1338  First Provider Initiated Contact with Patient 02/12/17 1429      Chief Complaint  Patient presents with  . vaginal discomfort   HPI Andrea Caldwell is a 26 y.o. G2P0101 at [redacted]w[redacted]d by LMP who presents with vaginal irritation & discharge. Patient was seen at urgent care earlier today complaining of pelvic pain & vaginal bleeding so was sent here for ectopic work up (per urgent care paperwork that patient brought with her). Patient states pelvic pain resolved since leaving urgent care & denies having had any vaginal bleeding. Reports symptoms of vaginal itching & thick white discharge x 3 days. Denies n/v/d, constipation, dysuria, vaginal bleeding.   Spanish interpreter at bedside.   OB History    Gravida Para Term Preterm AB Living   2 1   1   1    SAB TAB Ectopic Multiple Live Births         0 1      Past Medical History:  Diagnosis Date  . Medical history non-contributory     Past Surgical History:  Procedure Laterality Date  . NO PAST SURGERIES      Family History  Problem Relation Age of Onset  . Cancer Mother   . Hyperlipidemia Father   . Heart disease Father   . Diabetes Maternal Grandmother     Social History  Substance Use Topics  . Smoking status: Never Smoker  . Smokeless tobacco: Never Used  . Alcohol use No    Allergies: No Known Allergies  Prescriptions Prior to Admission  Medication Sig Dispense Refill Last Dose  . Prenatal Vit-Fe Fumarate-FA (PRENATAL MULTIVITAMIN) TABS tablet Take 1 tablet by mouth daily at 12 noon.   02/11/2017 at Unknown time  . ibuprofen (ADVIL,MOTRIN) 600 MG tablet Take 1 tablet (600 mg total) by mouth every 6 (six) hours. (Patient not taking: Reported on 02/12/2017) 30 tablet 0 Not Taking  . nitrofurantoin, macrocrystal-monohydrate, (MACROBID) 100 MG capsule Take 1 capsule (100 mg total) by mouth 2 (two) times daily. 1 po BID x 7days (Patient not taking:  Reported on 02/12/2017) 14 capsule 2 Not Taking    Review of Systems  Constitutional: Negative.   Gastrointestinal: Positive for abdominal pain (none currently). Negative for constipation, diarrhea, nausea and vomiting.  Genitourinary: Positive for vaginal discharge. Negative for dysuria and vaginal bleeding.   Physical Exam   Blood pressure (!) 110/52, pulse 78, temperature 98 F (36.7 C), resp. rate 18, weight 131 lb (59.4 kg), last menstrual period 12/09/2016, currently breastfeeding.  Physical Exam  Nursing note and vitals reviewed. Constitutional: She is oriented to person, place, and time. She appears well-developed and well-nourished. No distress.  HENT:  Head: Normocephalic and atraumatic.  Eyes: Conjunctivae are normal. Right eye exhibits no discharge. Left eye exhibits no discharge. No scleral icterus.  Neck: Normal range of motion.  Respiratory: Effort normal. No respiratory distress.  GI: Soft. She exhibits no distension. There is no tenderness.  Genitourinary: Uterus normal. Cervix exhibits no motion tenderness and no friability. Right adnexum displays no mass and no tenderness. Left adnexum displays no mass and no tenderness. No bleeding in the vagina. Vaginal discharge (white mucoid discharge at os & clumpy white discharge adherant to vaginal walls) found.  Genitourinary Comments: Cervix closed  Neurological: She is alert and oriented to person, place, and time.  Skin: Skin is warm and dry. She is not diaphoretic.  Psychiatric: She has a normal  mood and affect. Her behavior is normal. Judgment and thought content normal.    MAU Course  Procedures Results for orders placed or performed during the hospital encounter of 02/12/17 (from the past 48 hour(s))  Urinalysis, Routine w reflex microscopic     Status: Abnormal   Collection Time: 02/12/17  2:06 PM  Result Value Ref Range   Color, Urine STRAW (A) YELLOW   APPearance CLEAR CLEAR   Specific Gravity, Urine 1.006  1.005 - 1.030   pH 7.0 5.0 - 8.0   Glucose, UA NEGATIVE NEGATIVE mg/dL   Hgb urine dipstick NEGATIVE NEGATIVE   Bilirubin Urine NEGATIVE NEGATIVE   Ketones, ur NEGATIVE NEGATIVE mg/dL   Protein, ur NEGATIVE NEGATIVE mg/dL   Nitrite NEGATIVE NEGATIVE   Leukocytes, UA NEGATIVE NEGATIVE  CBC     Status: Abnormal   Collection Time: 02/12/17  2:43 PM  Result Value Ref Range   WBC 11.4 (H) 4.0 - 10.5 K/uL   RBC 4.86 3.87 - 5.11 MIL/uL   Hemoglobin 13.6 12.0 - 15.0 g/dL   HCT 16.139.8 09.636.0 - 04.546.0 %   MCV 81.9 78.0 - 100.0 fL   MCH 28.0 26.0 - 34.0 pg   MCHC 34.2 30.0 - 36.0 g/dL   RDW 40.913.0 81.111.5 - 91.415.5 %   Platelets 330 150 - 400 K/uL  hCG, quantitative, pregnancy     Status: Abnormal   Collection Time: 02/12/17  2:43 PM  Result Value Ref Range   hCG, Beta Chain, Quant, S 148,255 (H) <5 mIU/mL    Comment:          GEST. AGE      CONC.  (mIU/mL)   <=1 WEEK        5 - 50     2 WEEKS       50 - 500     3 WEEKS       100 - 10,000     4 WEEKS     1,000 - 30,000     5 WEEKS     3,500 - 115,000   6-8 WEEKS     12,000 - 270,000    12 WEEKS     15,000 - 220,000        FEMALE AND NON-PREGNANT FEMALE:     LESS THAN 5 mIU/mL   Wet prep, genital     Status: Abnormal   Collection Time: 02/12/17  2:50 PM  Result Value Ref Range   Yeast Wet Prep HPF POC NONE SEEN NONE SEEN   Trich, Wet Prep NONE SEEN NONE SEEN   Clue Cells Wet Prep HPF POC NONE SEEN NONE SEEN   WBC, Wet Prep HPF POC FEW (A) NONE SEEN    Comment: FEW BACTERIA SEEN   Sperm NONE SEEN    [place] Ob Comp Less 14 Wks  Result Date: 02/12/2017 CLINICAL DATA:  Pelvic pain affecting pregnancy. Gestational age by LMP of 9 weeks 2 days. EXAM: OBSTETRIC <14 WK ULTRASOUND TECHNIQUE: Transabdominal ultrasound was performed for evaluation of the gestation as well as the maternal uterus and adnexal regions. COMPARISON:  None. FINDINGS: Intrauterine gestational sac: Single Yolk sac:  Visualized. Embryo:  Visualized. Cardiac Activity: Visualized. Heart  Rate: 171 bpm CRL:   23  mm   8 w 6 d                  US EDC: 09/18/2017 Subchorionic hemorrhage:  None visualized. Maternal uterus/adnexae: Both ovaries are normal appearance. No mass or  abnormal free fluid identified. IMPRESSION: Single living IUP measuring 8 weeks 6 days, with [place] EDC of 09/18/2017. No significant maternal uterine or adnexal abnormality identified. Electronically Signed   By: Myles Rosenthal M.D.   On: 02/12/2017 15:36    MDM +UPT UA, wet prep, GC/chlamydia, CBC, ABO/Rh, quant hCG, HIV, and [place] today to rule out ectopic pregnancy A positive Wet prep negative -- will tx for yeast based on clinical presentation Ultrasound shows SIUP with cardiac activity Assessment and Plan  A: 1. Normal IUP (intrauterine pregnancy) on prenatal ultrasound, first trimester   2. Pelvic pain affecting pregnancy   3. Vaginal yeast infection    P: Discharge home Rx terazol Discussed reasons to return to MAU Start prenatal care GC/CT pending   Judeth Horn 02/12/2017, 2:29 PM

## 2017-02-12 NOTE — MAU Note (Signed)
Pt presents to MAU after being evaluated by urgent care for lower abdominal pain. Denies any VB or abnormal discharge

## 2017-02-12 NOTE — Patient Instructions (Addendum)
Please report to the Maternal Admissions Unit (MAU) at the [facility] for further work up including pelvic and transvaginal ultrasound given your pregnancy, pelvic discomfort and vaginal symptoms.  Address: [address] [place], [place] [address]  Phone: [phone]    Primer trimestre de Psychiatrist (First Trimester of Pregnancy) El primer trimestre de embarazo se extiende desde la semana1 hasta el final de la semana12 (mes1 al mes3). Una semana despus de que un espermatozoide fecunda un vulo, este se implantar en la pared uterina. Este embrin comenzar a Camera operator convertirse en un beb. Sus genes y los de su pareja forman el beb. Los genes del varn determinan si ser un nio o una nia. Entre la semana6 y [place], se forman los ojos y [place], y los latidos del corazn pueden verse en la ecografa. Al final de las 12semanas, todos los rganos del beb estn formados. Ahora que est embarazada, querr hacer todo lo que est a su alcance para tener un beb sano. Dos de las cosas ms importantes son Winferd Humphrey buena atencin prenatal y seguir las indicaciones del mdico. La atencin prenatal incluye toda la asistencia mdica que usted recibe antes del nacimiento del beb. Esta ayudar a prevenir, Engineer, manufacturing y tratar cualquier problema durante el embarazo y [place]. CAMBIOS EN EL ORGANISMO Su organismo atraviesa por muchos cambios durante el [place], y estos varan de Neomia Dear mujer a Educational psychologist.  Al principio, puede aumentar o bajar algunos kilos.  Puede tener Programme researcher, broadcasting/film/video (nuseas) y vomitar. Si no puede controlar los vmitos, llame al mdico.  Puede cansarse con facilidad.  Es posible que tenga dolores de cabeza que pueden aliviarse con los medicamentos que el mdico le permita tomar.  Puede orinar con mayor frecuencia. El dolor al orinar puede significar que usted tiene una infeccin de la vejiga.  Debido al Vanetta Mulders, puede tener acidez estomacal.  Puede estar  estreida, ya que ciertas hormonas enlentecen los movimientos de los msculos que [employer] desechos a travs de los intestinos.  Pueden aparecer hemorroides o abultarse e hincharse las venas (venas varicosas).  Las [employer] pueden empezar a Government social research officer y Emergency planning/management officer. Los pezones pueden sobresalir ms, y el tejido que los rodea (areola) tornarse ms oscuro.  Las Veterinary surgeon y estar sensibles al cepillado y al hilo dental.  Pueden aparecer zonas oscuras o manchas (cloasma, mscara del Psychiatrist) en el rostro que probablemente se atenuarn despus del nacimiento del beb.  Los perodos menstruales se interrumpirn.  Tal vez no tenga apetito.  Puede sentir un fuerte deseo de consumir ciertos alimentos.  Puede tener cambios a Theatre manager a da, por ejemplo, por momentos puede estar emocionada por el Psychiatrist y por otros preocuparse porque algo pueda salir mal con el embarazo o el beb.  Tendr sueos ms vvidos y extraos.  Tal vez haya cambios en el cabello que pueden incluir su engrosamiento, crecimiento rpido y cambios en la textura. A algunas mujeres tambin se les cae el cabello durante o despus del [place], o tienen el cabello seco o fino. Lo ms probable es que el cabello se le normalice despus del nacimiento del beb. QU DEBE ESPERAR EN LAS CONSULTAS PRENATALES Durante una visita prenatal de rutina:  La pesarn para asegurarse de que usted y el beb estn creciendo normalmente.  Le controlarn la presin arterial.  Le medirn el abdomen para controlar el desarrollo del beb.  Se escucharn los latidos cardacos a partir de la semana10 o la12 de embarazo, aproximadamente.  Se analizarn los resultados de los estudios solicitados en visitas anteriores. El mdico puede preguntarle:  Cmo se siente.  Si siente los movimientos del beb.  Si ha tenido sntomas anormales, como prdida de lquido, [place], dolores de cabeza intensos o clicos  abdominales.  Si est consumiendo algn producto que contenga tabaco, como cigarrillos, tabaco de Theatre manager y Administrator, Civil Service.  Si tiene [employer]. Otros estudios que pueden realizarse durante el primer trimestre incluyen lo siguiente:  Anlisis de sangre para determinar el tipo de sangre y Engineer, manufacturing la presencia de infecciones previas. Adems, se los usar para controlar si los niveles de hierro son bajos (anemia) y Chief Strategy Officer los anticuerpos Rh. En una etapa ms avanzada del [place], se harn anlisis de sangre para saber si tiene diabetes, junto con otros estudios si surgen problemas.  Anlisis de orina para detectar infecciones, diabetes o protenas en la orina.  Una ecografa para confirmar que el beb crece y se desarrolla correctamente.  Una amniocentesis para diagnosticar posibles problemas genticos.  Estudios del feto para descartar espina bfida y sndrome de [place].  Es posible que necesite otras pruebas adicionales.  Prueba del VIH (virus de inmunodeficiencia humana). Los exmenes prenatales de rutina incluyen la prueba de deteccin del VIH, a menos que decida no Futures trader. INSTRUCCIONES PARA EL CUIDADO EN EL HOGAR Medicamentos:  Siga las indicaciones del mdico en relacin con el uso de medicamentos. Durante el embarazo, hay medicamentos que pueden tomarse y otros que no.  Tome las vitaminas prenatales como se le indic.  Si est estreida, tome un laxante suave, si el mdico lo [place]. Dieta  Consuma alimentos balanceados. Elija alimentos variados, como carne o protenas de origen vegetal, pescado, leche y productos lcteos descremados, verduras, frutas y panes y Radiation protection practitioner. El mdico la ayudar a Production assistant, radio cantidad de peso que puede [place].  No coma carne cruda ni quesos sin cocinar. Estos elementos contienen bacterias que pueden causar defectos congnitos en el beb.  La ingesta diaria de cuatro o cinco comidas pequeas en lugar de tres  comidas abundantes puede ayudar a Yahoo nuseas y los vmitos. Si empieza a tener nuseas, comer algunas [address] puede ser de [place]. Beber lquidos [place] comidas en lugar de tomarlos durante las comidas tambin puede ayudar a Optician, dispensing las nuseas y los vmitos.  Si est estreida, consuma alimentos con alto contenido de [place], como verduras y frutas frescas, y Radiation protection practitioner. Beba suficiente lquido para Photographer orina clara o de color amarillo plido. Actividad y Landscape architect ejercicio solamente como se lo haya indicado el mdico. El ejercicio la ayudar a: ? Art gallery manager. ? Mantenerse en forma. ? Estar preparada para el trabajo de parto y [place].  Los dolores, los clicos en la parte baja del abdomen o los calambres en la cintura son un buen indicio de que debe dejar de Corporate treasurer. Consulte al mdico antes de seguir haciendo ejercicios normales.  Intente no estar de pie FedEx. Mueva las piernas con frecuencia si debe estar de pie en un lugar durante mucho tiempo.  Evite levantar pesos [employer].  Use zapatos de tacones bajos y [place].  Puede seguir teniendo [employer] [place], excepto que el mdico le indique lo contrario. Alivio del dolor o las molestias  Use un sostn que le brinde buen soporte si siente dolor a la palpacin Mattel.  Dese baos de asiento con agua tibia para Engineer, materials o las molestias causadas por  las hemorroides. Use crema antihemorroidal si el mdico se lo permite.  Descanse con las piernas elevadas si tiene calambres o dolor de cintura.  Si tiene venas varicosas en las piernas, use medias de descanso. Eleve los pies durante 15minutos, 3 o 4veces por da. Limite la cantidad de sal en su dieta. Cuidados prenatales  Programe las visitas prenatales para la semana12 de [place]. Generalmente se programan cada mes al principio y se hacen ms frecuentes en los 2 ltimos meses  antes del parto.  Escriba sus preguntas. Llvelas cuando concurra a las visitas prenatales.  Concurra a todas las visitas prenatales como se lo haya indicado el mdico. Seguridad  Colquese el cinturn de seguridad cuando conduzca.  Haga una lista de los nmeros de telfono de Associate Professoremergencia, que W. R. Berkleyincluya los nmeros de telfono de familiares, Box Elderamigos, el hospital y los departamentos de polica y bomberos. Consejos generales  Pdale al mdico que la derive a clases de educacin prenatal en su localidad. Debe comenzar a tomar las clases antes de Cytogeneticistentrar en el mes6 de embarazo.  Pida ayuda si tiene necesidades nutricionales o de asesoramiento Academic librariandurante el embarazo. El mdico puede aconsejarla o derivarla a especialistas para que la ayuden con diferentes necesidades.  No se d baos de inmersin en agua caliente, baos turcos ni saunas.  No se haga duchas vaginales ni use tampones o toallas higinicas perfumadas.  No mantenga las piernas cruzadas durante [place] tiempo.  Evite el contacto con las bandejas sanitarias de los gatos y la tierra que estos animales usan. Estos elementos contienen bacterias que pueden causar defectos congnitos al beb y la posible prdida del feto debido a un aborto espontneo o muerte fetal.  No fume, no consuma hierbas ni medicamentos que no hayan sido recetados por el mdico. Las sustancias qumicas que estos productos contienen afectan la formacin y el desarrollo del beb.  No consuma ningn producto que contenga tabaco, lo que incluye cigarrillos, tabaco de Theatre managermascar y Administrator, Civil Servicecigarrillos electrnicos. Si necesita ayuda para dejar de fumar, consulte al American Expressmdico. Puede recibir asesoramiento y otro tipo de recursos para dejar de fumar.  Programe una cita con el dentista. En su casa, lvese los dientes con un cepillo dental blando y psese el hilo dental con suavidad. SOLICITE ATENCIN MDICA SI:  Tiene mareos.  Siente clicos leves, presin en la pelvis o dolor persistente en  el abdomen.  Tiene nuseas, vmitos o diarrea persistentes.  Tiene secrecin vaginal con mal olor.  Siente dolor al ConocoPhillipsorinar.  Tiene el rostro, las Edgewater Estatesmanos, las piernas o los tobillos ms hinchados.  SOLICITE ATENCIN MDICA DE INMEDIATO SI:  Tiene fiebre.  Tiene una prdida de lquido por la vagina.  Tiene sangrado o pequeas prdidas vaginales.  Siente dolor intenso o clicos en el abdomen.  Sube o baja de peso rpidamente.  Vomita sangre de color rojo brillante o material que parezca granos de caf.  Ha estado expuesta a la rubola y no ha sufrido la enfermedad.  Ha estado expuesta a la quinta enfermedad o a la varicela.  Tiene un dolor de cabeza intenso.  Le falta el aire.  Sufre cualquier tipo de traumatismo, por ejemplo, debido a una cada o un accidente automovilstico.  Esta informacin no tiene Theme park managercomo fin reemplazar el consejo del mdico. Asegrese de hacerle al mdico cualquier pregunta que tenga. Document Released: 05/13/2005 Document Revised: 08/24/2014 Document Reviewed: 06/13/2013 Elsevier Interactive Patient Education  2017 [employer]

## 2017-02-12 NOTE — Discharge Instructions (Signed)
Candidiasis vaginal en los adultos °(Gastrointestinal Yeast Infection, Adult) °La candidiasis vaginal es una afección que causa dolor, hinchazón y enrojecimiento (inflamación) de la vagina. También causa secreción vaginal. Esta es una enfermedad frecuente. Algunas mujeres contraen esta infección con frecuencia. °CAUSAS °La causa de la infección es un cambio en el equilibrio normal de los hongos (cándida) y las bacterias que viven en la vagina. Esta alteración deriva en el crecimiento excesivo de los hongos, lo que causa la inflamación. °FACTORES DE RIESGO °Es más probable que esta afección se manifieste en: °· Las mujeres que toman antibióticos. °· Las mujeres que tienen diabetes. °· Las mujeres que toman anticonceptivos. °· Las mujeres que están embarazadas. °· Las mujeres que se hacen duchas vaginales con frecuencia. °· Las mujeres que tienen un sistema de defensa (inmunitario) débil. °· Las mujeres que han tomado corticoides durante mucho tiempo. °· Las mujeres que usan ropa ajustada con frecuencia. °SÍNTOMAS °Los síntomas de esta afección incluyen lo siguiente: °· Secreción vaginal blanca y espesa. °· Hinchazón, picazón, enrojecimiento e irritación de la vagina. Los labios de la vagina (vulva) también se pueden infectar. °· Dolor o ardor al orinar. °· Dolor durante las relaciones sexuales. °DIAGNÓSTICO °Esta afección se diagnostica mediante la historia clínica y un examen físico. Este incluye un examen pélvico. El médico examinará una muestra de la secreción vaginal con un microscopio. Probablemente el médico envíe esta muestra al laboratorio para analizarla y confirmar el diagnóstico. °TRATAMIENTO °Esta afección se trata con medicamentos. Los medicamentos pueden ser recetados o de venta libre. Podrán indicarle que use uno o más de lo siguiente: °· Medicamentos por vía oral. °· Medicamentos que se aplican como una crema. °· Medicamentos que se colocan directamente en la vagina (óvulos vaginales). °INSTRUCCIONES  PARA EL CUIDADO EN EL HOGAR °· Tome o aplíquese los medicamentos de venta libre y recetados solamente como se lo haya indicado el médico. °· No tenga relaciones sexuales hasta que el médico lo autorice. Comunique a su compañero sexual que tiene una infección por hongos. Esas personas deben consultar al médico si tienen síntomas. °· No use ropa ajustada, como pantis o pantalones ajustados. °· Evite el uso de tampones hasta que el médico lo autorice. °· Consuma más yogur. Esto puede ayudar a evitar la recurrencia de la candidiasis. °· Intente darse un baño de asiento para aliviar las molestias. Se trata de un baño de agua tibia que se toma mientras se está sentado. El agua solo debe llegar hasta las caderas y cubrir las nalgas. Hágalo 3 o 4 veces al día o como se lo haya indicado el médico. °· No se haga duchas vaginales. °· Use ropa interior transpirable de algodón. °· Si tiene diabetes, mantenga bajo control los niveles de azúcar en la sangre. °SOLICITE ATENCIÓN MÉDICA SI: °· Tiene fiebre. °· Los síntomas desaparecen y luego reaparecen. °· Los síntomas no mejoran con el tratamiento. °· Los síntomas empeoran. °· Aparecen nuevos síntomas. °· Aparecen ampollas alrededor o adentro de la vagina. °· Le sale sangre de la vagina y no está menstruando. °· Siente dolor en el abdomen. °Esta información no tiene como fin reemplazar el consejo del médico. Asegúrese de hacerle al médico cualquier pregunta que tenga. °Document Released: 05/13/2005 Document Revised: 11/25/2015 Document Reviewed: 02/04/2015 °Elsevier Interactive Patient Education © 2018 Elsevier Inc. ° °

## 2017-02-12 NOTE — Progress Notes (Signed)
  MRN: [id number] DOB: 01/15/1991  Subjective:   Andrea FillerLaura Caldwell is a 26 y.o. female presenting for chief complaint of Amenorrhea (pregnancy test ) and Dysuria (x 2 days )  Reports 2 day history of dysuria, urinary frequency, dark urine, nausea without vomiting, mild occasional pelvic discomfort this morning. Has had vaginal itching, some redness of vagina. Has had some vaginal bleeding this morning, slight vaginal discharge. Denies fever, flank pain, abdominal pain. Last menstrual cycle was 12/09/2016.   Andrea RiegerLaura has a current medication list which includes the following prescription(s): ibuprofen, nitrofurantoin (macrocrystal-monohydrate), norethindrone, and prenatal multivitamin. Also has No Known Allergies. Andrea RiegerLaura denies past medical and surgical history.   Objective:   Vitals: LMP 12/09/2016   Physical Exam  Constitutional: She is oriented to person, place, and time. She appears well-developed and well-nourished.  HENT:  Mouth/Throat: Oropharynx is clear and moist.  Cardiovascular: Normal rate, regular rhythm and intact distal pulses.  Exam reveals no gallop and no friction rub.   No murmur heard. Pulmonary/Chest: No respiratory distress. She has no wheezes. She has no rales.  Abdominal: Soft. Bowel sounds are normal. She exhibits no distension and no mass. There is tenderness (pelvic). There is no guarding.  No CVA tenderness.  Neurological: She is alert and oriented to person, place, and time.  Skin: Skin is warm and dry.   Results for orders placed or performed in visit on 02/12/17 (from the past 24 hour(s))  POCT urine pregnancy     Status: Abnormal   Collection Time: 02/12/17 11:31 AM  Result Value Ref Range   Preg Test, Ur Positive (A) Negative  POCT urinalysis dipstick     Status: Abnormal   Collection Time: 02/12/17 11:35 AM  Result Value Ref Range   Color, UA yellow yellow   Clarity, UA cloudy (A) clear   Glucose, UA negative negative mg/dL   Bilirubin, UA negative  negative   Ketones, POC UA negative negative mg/dL   Spec Grav, UA [id number] - 1.025   Blood, UA negative negative   pH, UA 8.5 (A) 5.0 - 8.0   Protein Ur, POC =30 (A) negative mg/dL   Urobilinogen, UA 0.2 0.2 or 1.0 E.U./dL   Nitrite, UA Negative Negative   Leukocytes, UA Negative Negative  POCT Microscopic Urinalysis ([facility])     Status: Abnormal   Collection Time: 02/12/17 11:44 AM  Result Value Ref Range   WBC,UR,HPF,POC None None WBC/hpf   RBC,UR,HPF,POC None None RBC/hpf   Bacteria Many (A) None, Too numerous to count   Mucus Absent Absent   Epithelial Cells, UR Per Microscopy Moderate (A) None, Too numerous to count cells/hpf   Assessment and Plan :   1. Pregnancy, unspecified gestational age 792. Pelvic pain in female 3. Dysuria 4. Vaginal itching 5. Missed period - Discussed need for evaluation at [facility]. Differential includes ectopic pregnancy, STI, complicated UTI. Patient agreed to present to [facility] immediately as she will need pelvic and transvaginal U/S.   Wallis BambergMario Kevona Lupinacci, PA-C [facility] [phone] 02/12/2017  11:39 AM

## 2017-02-15 LAB — GC/CHLAMYDIA PROBE AMP (~~LOC~~) NOT AT ARMC
Chlamydia: NEGATIVE
NEISSERIA GONORRHEA: NEGATIVE

## 2017-04-02 ENCOUNTER — Encounter: Payer: Self-pay | Admitting: Certified Nurse Midwife

## 2017-04-02 ENCOUNTER — Ambulatory Visit (INDEPENDENT_AMBULATORY_CARE_PROVIDER_SITE_OTHER): Payer: Self-pay | Admitting: Certified Nurse Midwife

## 2017-04-02 VITALS — BP 105/65 | HR 76 | Temp 97.6°F | Wt 132.0 lb

## 2017-04-02 DIAGNOSIS — Z3490 Encounter for supervision of normal pregnancy, unspecified, unspecified trimester: Secondary | ICD-10-CM

## 2017-04-02 DIAGNOSIS — N898 Other specified noninflammatory disorders of vagina: Secondary | ICD-10-CM

## 2017-04-02 DIAGNOSIS — O09212 Supervision of pregnancy with history of pre-term labor, second trimester: Secondary | ICD-10-CM

## 2017-04-02 DIAGNOSIS — O09892 Supervision of other high risk pregnancies, second trimester: Secondary | ICD-10-CM

## 2017-04-02 DIAGNOSIS — O0930 Supervision of pregnancy with insufficient antenatal care, unspecified trimester: Secondary | ICD-10-CM

## 2017-04-02 DIAGNOSIS — Z3482 Encounter for supervision of other normal pregnancy, second trimester: Secondary | ICD-10-CM

## 2017-04-02 DIAGNOSIS — Z113 Encounter for screening for infections with a predominantly sexual mode of transmission: Secondary | ICD-10-CM

## 2017-04-02 DIAGNOSIS — Z124 Encounter for screening for malignant neoplasm of cervix: Secondary | ICD-10-CM

## 2017-04-02 NOTE — Progress Notes (Signed)
Patient is here for NOB visit, she is late to care. Her first baby was borned 1 month early.

## 2017-04-05 ENCOUNTER — Telehealth: Payer: Self-pay

## 2017-04-05 DIAGNOSIS — O09212 Supervision of pregnancy with history of pre-term labor, second trimester: Secondary | ICD-10-CM

## 2017-04-05 DIAGNOSIS — O09892 Supervision of other high risk pregnancies, second trimester: Secondary | ICD-10-CM | POA: Insufficient documentation

## 2017-04-05 LAB — HEMOGLOBINOPATHY EVALUATION
HEMOGLOBIN A2 QUANTITATION: 2.8 % (ref 1.8–3.2)
HGB C: 0 %
HGB S: 0 %
HGB VARIANT: 0 %
Hemoglobin F Quantitation: 0 % (ref 0.0–2.0)
Hgb A: 97.2 % (ref 96.4–98.8)

## 2017-04-05 LAB — OBSTETRIC PANEL, INCLUDING HIV
ANTIBODY SCREEN: NEGATIVE
BASOS: 0 %
Basophils Absolute: 0 10*3/uL (ref 0.0–0.2)
EOS (ABSOLUTE): 0.1 10*3/uL (ref 0.0–0.4)
EOS: 1 %
HEMATOCRIT: 42.1 % (ref 34.0–46.6)
HEMOGLOBIN: 14 g/dL (ref 11.1–15.9)
HIV Screen 4th Generation wRfx: NONREACTIVE
Hepatitis B Surface Ag: NEGATIVE
IMMATURE GRANS (ABS): 0 10*3/uL (ref 0.0–0.1)
Immature Granulocytes: 0 %
LYMPHS: 23 %
Lymphocytes Absolute: 2.5 10*3/uL (ref 0.7–3.1)
MCH: 27.6 pg (ref 26.6–33.0)
MCHC: 33.3 g/dL (ref 31.5–35.7)
MCV: 83 fL (ref 79–97)
MONOCYTES: 6 %
MONOS ABS: 0.7 10*3/uL (ref 0.1–0.9)
Neutrophils Absolute: 7.8 10*3/uL — ABNORMAL HIGH (ref 1.4–7.0)
Neutrophils: 70 %
Platelets: 336 10*3/uL (ref 150–379)
RBC: 5.08 x10E6/uL (ref 3.77–5.28)
RDW: 14.4 % (ref 12.3–15.4)
RH TYPE: POSITIVE
RPR Ser Ql: NONREACTIVE
RUBELLA: 8.58 {index} (ref >0.99)
WBC: 11.1 10*3/uL — ABNORMAL HIGH (ref 3.4–10.8)

## 2017-04-05 LAB — HEMOGLOBIN A1C
Est. average glucose Bld gHb Est-mCnc: 105 mg/dL
HEMOGLOBIN A1C: 5.3 % (ref 4.8–5.6)

## 2017-04-05 LAB — VARICELLA ZOSTER ANTIBODY, IGG: Varicella zoster IgG: 494 index (ref >165)

## 2017-04-05 NOTE — Progress Notes (Signed)
Subjective:    Andrea Caldwell is being seen today for her first obstetrical visit.  This is a planned pregnancy. She is at [redacted]w[redacted]d gestation. Her obstetrical history is significant for preterm delivery at 36.6 weeks. Non-english speaking patient.  Relationship with FOB: spouse, living together. Patient does intend to breast feed. Pregnancy history fully reviewed.  The information documented in the HPI was reviewed and verified.  Menstrual History: OB History    Gravida Para Term Preterm AB Living   2 1   1   1    SAB TAB Ectopic Multiple Live Births         0 1       Patient's last menstrual period was 12/09/2016.    Past Medical History:  Diagnosis Date  . Headache   . Preterm labor    delivered 1 month early with first baby    Past Surgical History:  Procedure Laterality Date  . NO PAST SURGERIES       (Not in a hospital admission) No Known Allergies  Social History  Substance Use Topics  . Smoking status: Never Smoker  . Smokeless tobacco: Never Used  . Alcohol use No    Family History  Problem Relation Age of Onset  . Cancer Mother   . Hyperlipidemia Father   . Heart disease Father   . Diabetes Maternal Grandmother   . Kidney disease Maternal Aunt   . Diabetes Paternal Uncle      Review of Systems Constitutional: negative for weight loss Gastrointestinal: negative for vomiting Genitourinary:negative for genital lesions and vaginal discharge and dysuria Musculoskeletal:negative for back pain Behavioral/Psych: negative for abusive relationship, depression, illegal drug usage and tobacco use    Objective:    BP 105/65   Pulse 76   Temp 97.6 F (36.4 C)   Wt 132 lb (59.9 kg)   LMP 12/09/2016   BMI 22.54 kg/m  General Appearance:    Alert, cooperative, no distress, appears stated age  Head:    Normocephalic, without obvious abnormality, atraumatic  Eyes:    PERRL, conjunctiva/corneas clear, EOM's intact, fundi    benign, both eyes  Ears:    Normal  TM's and external ear canals, both ears  Nose:   Nares normal, septum midline, mucosa normal, no drainage    or sinus tenderness  Throat:   Lips, mucosa, and tongue normal; teeth and gums normal  Neck:   Supple, symmetrical, trachea midline, no adenopathy;    thyroid:  no enlargement/tenderness/nodules; no carotid   bruit or JVD  Back:     Symmetric, no curvature, ROM normal, no CVA tenderness  Lungs:     Clear to auscultation bilaterally, respirations unlabored  Chest Wall:    No tenderness or deformity   Heart:    Regular rate and rhythm, S1 and S2 normal, no murmur, rub   or gallop  Breast Exam:    No tenderness, masses, or nipple abnormality  Abdomen:     Soft, non-tender, bowel sounds active all four quadrants,    no masses, no organomegaly  Genitalia:    Normal female without lesion, discharge or tenderness  Extremities:   Extremities normal, atraumatic, no cyanosis or edema  Pulses:   2+ and symmetric all extremities  Skin:   Skin color, texture, turgor normal, no rashes or lesions  Lymph nodes:   Cervical, supraclavicular, and axillary nodes normal  Neurologic:   CNII-XII intact, normal strength, sensation and reflexes    throughout  Cervix: short on palpation.      Lab Review Urine pregnancy test Labs reviewed yes Radiologic studies reviewed no  Assessment & Plan    Pregnancy at [redacted]w[redacted]d weeks    Encounter for supervision of other normal pregnancy in second trimester  Encounter for supervision of normal pregnancy, antepartum, unspecified gravidity - Plan: Obstetric Panel, Including HIV, Hemoglobinopathy evaluation, Cystic Fibrosis Mutation 97, Culture, OB Urine, Cytology - PAP, Hemoglobin A1c, Varicella zoster antibody, IgG  Late prenatal care  H/O preterm delivery, currently pregnant, second trimester Offer 17-P per Dr. Jolayne Panther     Prenatal vitamins.  Counseling provided regarding continued use of seat belts, cessation of alcohol consumption, smoking or  use of illicit drugs; infection precautions i.e., influenza/TDAP immunizations, toxoplasmosis,CMV, parvovirus, listeria and varicella; workplace safety, exercise during pregnancy; routine dental care, safe medications, sexual activity, hot tubs, saunas, pools, travel, caffeine use, fish and methlymercury, potential toxins, hair treatments, varicose veins Weight gain recommendations per IOM guidelines reviewed: underweight/BMI< 18.5--> gain 28 - 40 lbs; normal weight/BMI 18.5 - 24.9--> gain 25 - 35 lbs; overweight/BMI 25 - 29.9--> gain 15 - 25 lbs; obese/BMI >30->gain  11 - 20 lbs Problem list reviewed and updated. FIRST/CF mutation testing/NIPT/QUAD SCREEN/fragile X/Ashkenazi Jewish population testing/Spinal muscular atrophy discussed: declined. Role of ultrasound in pregnancy discussed; fetal survey: ordered. Amniocentesis discussed: not indicated.   Orders Placed This Encounter  Procedures  . Culture, OB Urine  . Obstetric Panel, Including HIV  . Hemoglobinopathy evaluation  . Cystic Fibrosis Mutation 97  . Hemoglobin A1c  . Varicella zoster antibody, IgG    Follow up in 4 weeks. 50% of 30 min visit spent on counseling and coordination of care.

## 2017-04-05 NOTE — Telephone Encounter (Signed)
Spanish Interpreter [phone] Judeth Cornfield was used to communicate with patient. She was notified of need for 17P injections, patient said that she would think about it and let us know at her next visit.

## 2017-04-06 ENCOUNTER — Other Ambulatory Visit: Payer: Self-pay | Admitting: Certified Nurse Midwife

## 2017-04-06 DIAGNOSIS — B373 Candidiasis of vulva and vagina: Secondary | ICD-10-CM

## 2017-04-06 DIAGNOSIS — B3731 Acute candidiasis of vulva and vagina: Secondary | ICD-10-CM

## 2017-04-06 DIAGNOSIS — Z3480 Encounter for supervision of other normal pregnancy, unspecified trimester: Secondary | ICD-10-CM

## 2017-04-06 LAB — CERVICOVAGINAL ANCILLARY ONLY
BACTERIAL VAGINITIS: NEGATIVE
CHLAMYDIA, DNA PROBE: NEGATIVE
Candida vaginitis: POSITIVE — AB
NEISSERIA GONORRHEA: NEGATIVE
Trichomonas: NEGATIVE

## 2017-04-06 MED ORDER — FLUCONAZOLE 150 MG PO TABS: 150.0000 mg | ORAL_TABLET | Freq: Once | ORAL | 0 refills | Status: AC

## 2017-04-06 MED ORDER — TERCONAZOLE 0.8 % VA CREA: 1.0000 | TOPICAL_CREAM | Freq: Every day | VAGINAL | 0 refills | Status: DC

## 2017-04-06 NOTE — Addendum Note (Signed)
Addended by: Hamilton Capri on: 04/06/2017 08:11 AM   Modules accepted: Orders

## 2017-04-07 ENCOUNTER — Other Ambulatory Visit: Payer: Self-pay | Admitting: Certified Nurse Midwife

## 2017-04-07 DIAGNOSIS — Z3480 Encounter for supervision of other normal pregnancy, unspecified trimester: Secondary | ICD-10-CM

## 2017-04-07 DIAGNOSIS — B951 Streptococcus, group B, as the cause of diseases classified elsewhere: Secondary | ICD-10-CM

## 2017-04-07 DIAGNOSIS — O2342 Unspecified infection of urinary tract in pregnancy, second trimester: Secondary | ICD-10-CM

## 2017-04-07 LAB — URINE CULTURE, OB REFLEX

## 2017-04-07 LAB — CULTURE, OB URINE

## 2017-04-07 MED ORDER — NITROFURANTOIN MONOHYD MACRO 100 MG PO CAPS: 100.0000 mg | ORAL_CAPSULE | Freq: Two times a day (BID) | ORAL | 0 refills | Status: DC

## 2017-04-08 ENCOUNTER — Other Ambulatory Visit: Payer: Self-pay | Admitting: Certified Nurse Midwife

## 2017-04-08 ENCOUNTER — Telehealth: Payer: Self-pay

## 2017-04-08 DIAGNOSIS — Z3480 Encounter for supervision of other normal pregnancy, unspecified trimester: Secondary | ICD-10-CM

## 2017-04-08 LAB — CYTOLOGY - PAP
ADEQUACY: ABSENT
DIAGNOSIS: NEGATIVE

## 2017-04-08 NOTE — Telephone Encounter (Signed)
-----   Message from Roe Coombs, CNM sent at 04/07/2017  4:39 PM EDT ----- Please let her know that she has a UTI and macrobid has been sent in for her to take.  Please also tell her that if she has any fever, increased back pain with N&V or decreased urine output to let us know. Thank you.

## 2017-04-08 NOTE — Telephone Encounter (Signed)
-----   Message from Roe Coombs, CNM sent at 04/06/2017  4:52 PM EDT ----- Please let her know that she has a yeast infection.  Diflucan and Terconazole vaginal cream was sent to the pharmacy for her to use.  Thank you. R.Denney CNM

## 2017-04-08 NOTE — Telephone Encounter (Signed)
Patient notified via interpreter Byrd Hesselbach) of results and Rx.Marland Kitchen

## 2017-04-08 NOTE — Telephone Encounter (Signed)
Patient notified via interpreter (Maria) of results and Rx.. 

## 2017-04-09 LAB — CYSTIC FIBROSIS MUTATION 97: Interpretation: NOT DETECTED

## 2017-04-13 ENCOUNTER — Other Ambulatory Visit: Payer: Self-pay | Admitting: Certified Nurse Midwife

## 2017-04-13 DIAGNOSIS — Z3480 Encounter for supervision of other normal pregnancy, unspecified trimester: Secondary | ICD-10-CM

## 2017-04-30 ENCOUNTER — Encounter: Payer: Self-pay | Admitting: Certified Nurse Midwife

## 2017-04-30 ENCOUNTER — Ambulatory Visit (INDEPENDENT_AMBULATORY_CARE_PROVIDER_SITE_OTHER): Payer: Self-pay | Admitting: Certified Nurse Midwife

## 2017-04-30 VITALS — BP 109/71 | HR 86 | Wt 135.8 lb

## 2017-04-30 DIAGNOSIS — O09212 Supervision of pregnancy with history of pre-term labor, second trimester: Secondary | ICD-10-CM

## 2017-04-30 DIAGNOSIS — Z3482 Encounter for supervision of other normal pregnancy, second trimester: Secondary | ICD-10-CM

## 2017-04-30 DIAGNOSIS — B951 Streptococcus, group B, as the cause of diseases classified elsewhere: Secondary | ICD-10-CM

## 2017-04-30 DIAGNOSIS — Z789 Other specified health status: Secondary | ICD-10-CM

## 2017-04-30 DIAGNOSIS — Z23 Encounter for immunization: Secondary | ICD-10-CM

## 2017-04-30 DIAGNOSIS — Z3480 Encounter for supervision of other normal pregnancy, unspecified trimester: Secondary | ICD-10-CM

## 2017-04-30 MED ORDER — HYDROXYPROGESTERONE CAPROATE 250 MG/ML IM OIL
250.0000 mg | TOPICAL_OIL | INTRAMUSCULAR | Status: DC
  Administered 2017-04-30 – 2017-08-13 (×5): 250 mg via INTRAMUSCULAR

## 2017-04-30 NOTE — Progress Notes (Signed)
   PRENATAL VISIT NOTE  Subjective:  Andrea Caldwell is a 26 y.o. G2P0101 at [redacted]w[redacted]d being seen today for ongoing prenatal care.  She is currently monitored for the following issues for this high-risk pregnancy and has Supervision of normal pregnancy, antepartum; Late prenatal care; H/O preterm delivery, currently pregnant, second trimester; and Positive GBS test on her problem list.  Patient reports no complaints.  Contractions: Not present. Vag. Bleeding: None.  Movement: Present. Denies leaking of fluid.   The following portions of the patient's history were reviewed and updated as appropriate: allergies, current medications, past family history, past medical history, past social history, past surgical history and problem list. Problem list updated.  Objective:   Vitals:   04/30/17 0848  BP: 109/71  Pulse: 86  Weight: 135 lb 12.8 oz (61.6 kg)    Fetal Status: Fetal Heart Rate (bpm): 141 Fundal Height: 19 cm Movement: Present     General:  Alert, oriented and cooperative. Patient is in no acute distress.  Skin: Skin is warm and dry. No rash noted.   Cardiovascular: Normal heart rate noted  Respiratory: Normal respiratory effort, no problems with respiration noted  Abdomen: Soft, gravid, appropriate for gestational age.  Pain/Pressure: Absent     Pelvic: Cervical exam deferred        Extremities: Normal range of motion.  Edema: None  Mental Status:  Normal mood and affect. Normal behavior. Normal judgment and thought content.   Assessment and Plan:  Pregnancy: G2P0101 at [redacted]w[redacted]d  1. Supervision of other normal pregnancy, antepartum      17-P discussed at length, information given to patient in Spanish. Video interpreter used.   - Flu Vaccine QUAD 36+ mos IM (Fluarix, Quad PF) - hydroxyprogesterone caproate (MAKENA) 250 mg/mL injection 250 mg; Inject 1 mL (250 mg total) into the muscle once a week.  2. Positive GBS test     PCN with labor/delivery.   Preterm labor symptoms  and general obstetric precautions including but not limited to vaginal bleeding, contractions, leaking of fluid and fetal movement were reviewed in detail with the patient. Please refer to After Visit Summary for other counseling recommendations.  Return in about 4 weeks (around 05/28/2017) for ROB, 17-P.   Roe Coombs, CNM

## 2017-04-30 NOTE — Progress Notes (Signed)
Administered 17-p and pt tolerated well.

## 2017-05-07 ENCOUNTER — Ambulatory Visit (INDEPENDENT_AMBULATORY_CARE_PROVIDER_SITE_OTHER): Payer: Self-pay

## 2017-05-07 DIAGNOSIS — O09212 Supervision of pregnancy with history of pre-term labor, second trimester: Secondary | ICD-10-CM

## 2017-05-07 DIAGNOSIS — O09892 Supervision of other high risk pregnancies, second trimester: Secondary | ICD-10-CM

## 2017-05-07 MED ORDER — HYDROXYPROGESTERONE CAPROATE 275 MG/1.1ML ~~LOC~~ SOAJ
275.0000 mg | Freq: Once | SUBCUTANEOUS | Status: AC
  Administered 2017-05-07: 275 mg via SUBCUTANEOUS

## 2017-05-07 NOTE — Progress Notes (Signed)
Pt here for 17p inj. Inj given in the left arm. Pt tolerated well.

## 2017-05-14 ENCOUNTER — Ambulatory Visit (INDEPENDENT_AMBULATORY_CARE_PROVIDER_SITE_OTHER): Payer: Self-pay

## 2017-05-14 DIAGNOSIS — O09892 Supervision of other high risk pregnancies, second trimester: Secondary | ICD-10-CM

## 2017-05-14 DIAGNOSIS — O09212 Supervision of pregnancy with history of pre-term labor, second trimester: Secondary | ICD-10-CM

## 2017-05-14 MED ORDER — HYDROXYPROGESTERONE CAPROATE 275 MG/1.1ML ~~LOC~~ SOAJ
275.0000 mg | Freq: Once | SUBCUTANEOUS | Status: AC
  Administered 2017-05-14: 275 mg via SUBCUTANEOUS

## 2017-05-14 NOTE — Progress Notes (Signed)
Nurse visit for pt supplied 17p given upper R arm subc.w/o complaint

## 2017-05-21 ENCOUNTER — Ambulatory Visit (INDEPENDENT_AMBULATORY_CARE_PROVIDER_SITE_OTHER): Payer: Self-pay

## 2017-05-21 DIAGNOSIS — O09212 Supervision of pregnancy with history of pre-term labor, second trimester: Secondary | ICD-10-CM

## 2017-05-21 DIAGNOSIS — O09892 Supervision of other high risk pregnancies, second trimester: Secondary | ICD-10-CM

## 2017-05-21 MED ORDER — HYDROXYPROGESTERONE CAPROATE 275 MG/1.1ML ~~LOC~~ SOAJ
275.0000 mg | Freq: Once | SUBCUTANEOUS | Status: AC
  Administered 2017-05-21: 275 mg via SUBCUTANEOUS

## 2017-05-21 NOTE — Progress Notes (Signed)
Pt presents for pt supplied Makena given L arm w/o complaints.

## 2017-05-28 ENCOUNTER — Ambulatory Visit (INDEPENDENT_AMBULATORY_CARE_PROVIDER_SITE_OTHER): Payer: Self-pay | Admitting: Certified Nurse Midwife

## 2017-05-28 VITALS — BP 119/71 | HR 91 | Wt 141.0 lb

## 2017-05-28 DIAGNOSIS — Z789 Other specified health status: Secondary | ICD-10-CM

## 2017-05-28 DIAGNOSIS — O09892 Supervision of other high risk pregnancies, second trimester: Secondary | ICD-10-CM

## 2017-05-28 DIAGNOSIS — O0932 Supervision of pregnancy with insufficient antenatal care, second trimester: Secondary | ICD-10-CM

## 2017-05-28 DIAGNOSIS — Z3480 Encounter for supervision of other normal pregnancy, unspecified trimester: Secondary | ICD-10-CM

## 2017-05-28 DIAGNOSIS — O0930 Supervision of pregnancy with insufficient antenatal care, unspecified trimester: Secondary | ICD-10-CM

## 2017-05-28 DIAGNOSIS — O09212 Supervision of pregnancy with history of pre-term labor, second trimester: Secondary | ICD-10-CM

## 2017-05-28 NOTE — Progress Notes (Signed)
   PRENATAL VISIT NOTE  Subjective:  Andrea Caldwell is a 26 y.o. G2P0101 at [redacted]w[redacted]d being seen today for ongoing prenatal care.  She is currently monitored for the following issues for this high-risk pregnancy and has Supervision of normal pregnancy, antepartum; Late prenatal care; H/O preterm delivery, currently pregnant, second trimester; Positive GBS test; and Non-English speaking patient on her problem list.  Patient reports backache, no bleeding, no contractions, no cramping and no leaking.  Contractions: Not present. Vag. Bleeding: None.  Movement: Present. Denies leaking of fluid.   The following portions of the patient's history were reviewed and updated as appropriate: allergies, current medications, past family history, past medical history, past social history, past surgical history and problem list. Problem list updated.  Objective:   Vitals:   05/28/17 1051  BP: 119/71  Pulse: 91  Weight: 141 lb (64 kg)    Fetal Status: Fetal Heart Rate (bpm): 160; doppler Fundal Height: 24 cm Movement: Present     General:  Alert, oriented and cooperative. Patient is in no acute distress.  Skin: Skin is warm and dry. No rash noted.   Cardiovascular: Normal heart rate noted  Respiratory: Normal respiratory effort, no problems with respiration noted  Abdomen: Soft, gravid, appropriate for gestational age.  Pain/Pressure: Absent     Pelvic: Cervical exam deferred        Extremities: Normal range of motion.  Edema: None  Mental Status:  Normal mood and affect. Normal behavior. Normal judgment and thought content.   Assessment and Plan:  Pregnancy: G2P0101 at [redacted]w[redacted]d  1. Supervision of other normal pregnancy, antepartum     Doing well  2. Non-English speaking patient     Video interpreter used  3. Late prenatal care      wks  4. H/O preterm delivery, currently pregnant, second trimester     On 17-P, doing well.    Preterm labor symptoms and general obstetric precautions  including but not limited to vaginal bleeding, contractions, leaking of fluid and fetal movement were reviewed in detail with the patient. Please refer to After Visit Summary for other counseling recommendations.  Return in about 4 weeks (around 06/25/2017) for [facility], 2 hr OGTT.   Roe Coombs, CNM

## 2017-05-28 NOTE — Progress Notes (Signed)
Patient reports good fetal movement, denies pain. 

## 2017-06-04 ENCOUNTER — Ambulatory Visit (INDEPENDENT_AMBULATORY_CARE_PROVIDER_SITE_OTHER): Payer: Self-pay

## 2017-06-04 VITALS — BP 109/67 | HR 89

## 2017-06-04 DIAGNOSIS — O09212 Supervision of pregnancy with history of pre-term labor, second trimester: Secondary | ICD-10-CM

## 2017-06-04 DIAGNOSIS — O09892 Supervision of other high risk pregnancies, second trimester: Secondary | ICD-10-CM

## 2017-06-04 MED ORDER — HYDROXYPROGESTERONE CAPROATE 275 MG/1.1ML ~~LOC~~ SOAJ: 275.0000 mg | Freq: Once | SUBCUTANEOUS | Status: DC

## 2017-06-04 MED ORDER — HYDROXYPROGESTERONE CAPROATE 250 MG/ML IM OIL
250.0000 mg | TOPICAL_OIL | Freq: Once | INTRAMUSCULAR | Status: AC
  Administered 2017-06-04: 250 mg via INTRAMUSCULAR

## 2017-06-04 NOTE — Progress Notes (Signed)
Pt here for 17p. Inj given in left deltoid. Pt tolerated well.

## 2017-06-11 ENCOUNTER — Ambulatory Visit (INDEPENDENT_AMBULATORY_CARE_PROVIDER_SITE_OTHER): Payer: Self-pay

## 2017-06-11 VITALS — BP 92/56 | HR 91 | Wt 141.0 lb

## 2017-06-11 DIAGNOSIS — O09212 Supervision of pregnancy with history of pre-term labor, second trimester: Secondary | ICD-10-CM

## 2017-06-11 DIAGNOSIS — O09892 Supervision of other high risk pregnancies, second trimester: Secondary | ICD-10-CM

## 2017-06-11 NOTE — Progress Notes (Signed)
Presents for 17P Injection.  Given in right deltoid, tolerated well.  Administrations This Visit    hydroxyprogesterone caproate (MAKENA) 250 mg/mL injection 250 mg    Admin Date 06/11/2017 Action Given Dose 250 mg Route Intramuscular Administered By Maretta BeesMcGlashan, Carol J, RMA

## 2017-06-14 NOTE — Progress Notes (Signed)
Agree with nursing staff's documentation of this patient's clinic encounter.  Shahab Polhamus A [place] Hanlon, CNM    

## 2017-06-18 ENCOUNTER — Ambulatory Visit (INDEPENDENT_AMBULATORY_CARE_PROVIDER_SITE_OTHER): Payer: Self-pay

## 2017-06-18 DIAGNOSIS — O09213 Supervision of pregnancy with history of pre-term labor, third trimester: Secondary | ICD-10-CM

## 2017-06-18 DIAGNOSIS — O09893 Supervision of other high risk pregnancies, third trimester: Secondary | ICD-10-CM

## 2017-06-18 MED ORDER — HYDROXYPROGESTERONE CAPROATE 275 MG/1.1ML ~~LOC~~ SOAJ
275.0000 mg | Freq: Once | SUBCUTANEOUS | Status: AC
  Administered 2017-06-18: 275 mg via SUBCUTANEOUS

## 2017-06-18 NOTE — Progress Notes (Signed)
Nurse visit for pt supplied Makena given SQ L arm w/o difficulty.  2 gtt scheduled NV 11/9, reminded pt to fast at midnight up until testing is complete. Pt agrees.

## 2017-06-25 ENCOUNTER — Encounter: Payer: Self-pay | Admitting: Obstetrics

## 2017-06-25 ENCOUNTER — Other Ambulatory Visit: Payer: Self-pay

## 2017-06-25 ENCOUNTER — Telehealth: Payer: Self-pay | Admitting: Pediatrics

## 2017-06-25 ENCOUNTER — Ambulatory Visit (INDEPENDENT_AMBULATORY_CARE_PROVIDER_SITE_OTHER): Payer: Self-pay | Admitting: Obstetrics

## 2017-06-25 VITALS — BP 117/70 | HR 87 | Wt 145.6 lb

## 2017-06-25 DIAGNOSIS — O0990 Supervision of high risk pregnancy, unspecified, unspecified trimester: Secondary | ICD-10-CM

## 2017-06-25 DIAGNOSIS — O09893 Supervision of other high risk pregnancies, third trimester: Secondary | ICD-10-CM

## 2017-06-25 DIAGNOSIS — O09213 Supervision of pregnancy with history of pre-term labor, third trimester: Secondary | ICD-10-CM

## 2017-06-25 DIAGNOSIS — O0993 Supervision of high risk pregnancy, unspecified, third trimester: Secondary | ICD-10-CM

## 2017-06-25 NOTE — Progress Notes (Signed)
ROB/GTT/17P. Makena given in right deltoid, tolerated well. TDAP deferred until after delivery.

## 2017-06-25 NOTE — Telephone Encounter (Signed)
Pt needs refill on Makena.  I called pharmacy, line was busy.

## 2017-06-25 NOTE — Progress Notes (Signed)
Subjective:  Andrea FillerLaura Caldwell is a 26 y.o. G2P0101 at [redacted]w[redacted]d being seen today for ongoing prenatal care.  She is currently monitored for the following issues for this high-risk pregnancy and has Supervision of normal pregnancy, antepartum; Late prenatal care; H/O preterm delivery, currently pregnant, second trimester; Positive GBS test; and Non-English speaking patient on their problem list.  Patient reports no complaints.  Contractions: Not present. Vag. Bleeding: None.  Movement: Present. Denies leaking of fluid.   The following portions of the patient's history were reviewed and updated as appropriate: allergies, current medications, past family history, past medical history, past social history, past surgical history and problem list. Problem list updated.  Objective:   Vitals:   06/25/17 0828  BP: 117/70  Pulse: 87  Weight: 145 lb 9.6 oz (66 kg)    Fetal Status: Fetal Heart Rate (bpm): 160   Movement: Present     General:  Alert, oriented and cooperative. Patient is in no acute distress.  Skin: Skin is warm and dry. No rash noted.   Cardiovascular: Normal heart rate noted  Respiratory: Normal respiratory effort, no problems with respiration noted  Abdomen: Soft, gravid, appropriate for gestational age. Pain/Pressure: Absent     Pelvic:  Cervical exam deferred        Extremities: Normal range of motion.  Edema: None  Mental Status: Normal mood and affect. Normal behavior. Normal judgment and thought content.   Urinalysis:      Assessment and Plan:  Pregnancy: G2P0101 at [redacted]w[redacted]d  1. Supervision of high risk pregnancy, antepartum   2. History of preterm delivery, currently pregnant in third trimester - weekly 17-P injections until 37 weeks  Preterm labor symptoms and general obstetric precautions including but not limited to vaginal bleeding, contractions, leaking of fluid and fetal movement were reviewed in detail with the patient. Please refer to After Visit Summary for  other counseling recommendations.  Return in about 1 week (around 07/02/2017) for ROB.   Brock BadHarper, Charles A, MD

## 2017-06-26 LAB — CBC
Hematocrit: 36.2 % (ref 34.0–46.6)
Hemoglobin: 12.3 g/dL (ref 11.1–15.9)
MCH: 27.8 pg (ref 26.6–33.0)
MCHC: 34 g/dL (ref 31.5–35.7)
MCV: 82 fL (ref 79–97)
PLATELETS: 304 10*3/uL (ref 150–379)
RBC: 4.42 x10E6/uL (ref 3.77–5.28)
RDW: 14.2 % (ref 12.3–15.4)
WBC: 10.4 10*3/uL (ref 3.4–10.8)

## 2017-06-26 LAB — RPR: RPR Ser Ql: NONREACTIVE

## 2017-06-26 LAB — GLUCOSE TOLERANCE, 2 HOURS W/ 1HR
GLUCOSE, 1 HOUR: 145 mg/dL (ref 65–179)
Glucose, 2 hour: 115 mg/dL (ref 65–152)
Glucose, Fasting: 86 mg/dL (ref 65–91)

## 2017-06-26 LAB — HIV ANTIBODY (ROUTINE TESTING W REFLEX): HIV Screen 4th Generation wRfx: NONREACTIVE

## 2017-07-02 ENCOUNTER — Ambulatory Visit (INDEPENDENT_AMBULATORY_CARE_PROVIDER_SITE_OTHER): Payer: Self-pay

## 2017-07-02 DIAGNOSIS — O09893 Supervision of other high risk pregnancies, third trimester: Secondary | ICD-10-CM

## 2017-07-02 DIAGNOSIS — O09213 Supervision of pregnancy with history of pre-term labor, third trimester: Secondary | ICD-10-CM

## 2017-07-02 MED ORDER — HYDROXYPROGESTERONE CAPROATE 275 MG/1.1ML ~~LOC~~ SOAJ
275.0000 mg | Freq: Once | SUBCUTANEOUS | Status: AC
  Administered 2017-07-07: 275 mg via SUBCUTANEOUS

## 2017-07-02 NOTE — Progress Notes (Signed)
Nurse visit for pt supplied Makena given L arm w/o difficulty. I called [facility] pharmacy to request refills.

## 2017-07-05 NOTE — Telephone Encounter (Signed)
Andrea Caldwell received in office today

## 2017-07-07 ENCOUNTER — Ambulatory Visit (INDEPENDENT_AMBULATORY_CARE_PROVIDER_SITE_OTHER): Payer: Self-pay | Admitting: Nurse Practitioner

## 2017-07-07 ENCOUNTER — Encounter: Payer: Self-pay | Admitting: Nurse Practitioner

## 2017-07-07 VITALS — BP 111/68 | HR 90 | Wt 146.0 lb

## 2017-07-07 DIAGNOSIS — O09893 Supervision of other high risk pregnancies, third trimester: Secondary | ICD-10-CM

## 2017-07-07 DIAGNOSIS — O09213 Supervision of pregnancy with history of pre-term labor, third trimester: Secondary | ICD-10-CM

## 2017-07-07 DIAGNOSIS — O0990 Supervision of high risk pregnancy, unspecified, unspecified trimester: Secondary | ICD-10-CM

## 2017-07-07 DIAGNOSIS — O09892 Supervision of other high risk pregnancies, second trimester: Secondary | ICD-10-CM

## 2017-07-07 DIAGNOSIS — Z3480 Encounter for supervision of other normal pregnancy, unspecified trimester: Secondary | ICD-10-CM

## 2017-07-07 DIAGNOSIS — B951 Streptococcus, group B, as the cause of diseases classified elsewhere: Secondary | ICD-10-CM

## 2017-07-07 DIAGNOSIS — O09212 Supervision of pregnancy with history of pre-term labor, second trimester: Secondary | ICD-10-CM

## 2017-07-07 DIAGNOSIS — O0993 Supervision of high risk pregnancy, unspecified, third trimester: Secondary | ICD-10-CM

## 2017-07-07 DIAGNOSIS — Z3483 Encounter for supervision of other normal pregnancy, third trimester: Secondary | ICD-10-CM

## 2017-07-07 NOTE — Progress Notes (Signed)
    Subjective:  Andrea Caldwell is a 26 y.o. G2P0101 at 5240w0d being seen today for ongoing prenatal care.  She is currently monitored for the following issues for this high-risk pregnancy and has Supervision of normal pregnancy, antepartum; Late prenatal care; H/O preterm delivery, currently pregnant, second trimester; Positive GBS test; and Non-English speaking patient on their problem list.  Patient reports no complaints.  Contractions: Not present. Vag. Bleeding: None.  Movement: Present. Denies leaking of fluid.   The following portions of the patient's history were reviewed and updated as appropriate: allergies, current medications, past family history, past medical history, past social history, past surgical history and problem list. Problem list updated.  Objective:   Vitals:   07/07/17 1351  BP: 111/68  Pulse: 90  Weight: 146 lb (66.2 kg)    Fetal Status: Fetal Heart Rate (bpm): 132 Fundal Height: 31 cm Movement: Present     General:  Alert, oriented and cooperative. Patient is in no acute distress.  Skin: Skin is warm and dry. No rash noted.   Cardiovascular: Normal heart rate noted  Respiratory: Normal respiratory effort, no problems with respiration noted  Abdomen: Soft, gravid, appropriate for gestational age. Pain/Pressure: Absent     Pelvic:  Cervical exam deferred        Extremities: Normal range of motion.  Edema: Trace  Mental Status: Normal mood and affect. Normal behavior. Normal judgment and thought content.   Urinalysis:      Assessment and Plan:  Pregnancy: G2P0101 at 8640w0d  Patient reports good fetal movement, denies pain.   1. Supervision of other normal pregnancy, antepartum Supervision of high risk pregnancy due to previous preterm birth currently receiving 17P - got that today.  Denies any new symptoms of abdominal pain, back pain, vaginal bleeding or leaking of fluid.  Preterm labor symptoms and general obstetric precautions including but not  limited to vaginal bleeding, contractions, leaking of fluid and fetal movement were reviewed in detail with the patient. Please refer to After Visit Summary for other counseling recommendations.  Return in about 1 week (around 07/14/2017) for for 17P and in 2 weeks for prenatal visit.  Nolene BernheimERRI BURLESON, RN, MSN, NP-BC Nurse Practitioner, [facility], [facility] 07/07/2017 2:19 PM

## 2017-07-07 NOTE — Patient Instructions (Addendum)
Contracciones de Braxton Hicks °(Braxton Hicks Contractions) °Durante el embarazo, pueden presentarse contracciones uterinas que no siempre indican que está en trabajo de parto. °¿QUÉ SON LAS CONTRACCIONES DE BRAXTON HICKS? °Las contracciones que se presentan antes del trabajo de parto se conocen como contracciones de Braxton Hicks o falso trabajo de parto. Hacia el final del embarazo (32 a 34 semanas), estas contracciones pueden aparecen con más frecuencia y volverse más intensas. No corresponden al trabajo de parto verdadero porque estas contracciones no producen el agrandamiento (la dilatación) y el afinamiento del cuello del útero. Algunas veces, es difícil distinguirlas del trabajo de parto verdadero porque en algunos casos pueden ser muy intensas, y las personas tienen diferentes niveles de tolerancia al dolor. No debe sentirse avergonzada si concurre al hospital con falso trabajo de parto. En ocasiones, la única forma de saber si el trabajo de parto es verdadero es que el médico determine si hay cambios en el cuello del útero. °Si no hay problemas prenatales u otras complicaciones de salud asociadas con el embarazo, no habrá inconvenientes si la envían a su casa con falso trabajo de parto y espera que comience el verdadero. °CÓMO DIFERENCIAR EL TRABAJO DE PARTO FALSO DEL VERDADERO °Falso trabajo de parto  °· Las contracciones del falso trabajo de parto duran menos y no son tan intensas como las verdaderas. °· Generalmente son irregulares. °· A menudo, se sienten en la parte delantera de la parte baja del abdomen y en la ingle, °· y pueden desaparecer cuando camina o cambia de posición mientras está acostada. °· Las contracciones se vuelven más débiles y su duración es menor a medida que el tiempo transcurre. °· Por lo general, no se hacen progresivamente más intensas, regulares y cercanas entre sí como en el caso del trabajo de parto verdadero. °Verdadero trabajo de parto  °· Las contracciones del verdadero  trabajo de parto duran de 30 a 70 segundos, son muy regulares y suelen volverse más intensas, y aumenta su frecuencia. °· No desaparecen cuando camina. °· La molestia generalmente se siente en la parte superior del útero y se extiende hacia la zona inferior del abdomen y hacia la cintura. °· El médico podrá examinarla para determinar si el trabajo de parto es verdadero. El examen mostrará si el cuello del útero se está dilatando y afinando. °LO QUE DEBE RECORDAR °· Continúe haciendo los ejercicios habituales y siga otras indicaciones que el médico le dé. °· Tome todos los medicamentos como le indicó el médico. °· Concurra a las visitas prenatales regulares. °· Coma y beba con moderación si cree que está en trabajo de parto. °· Si las contracciones de Braxton Hicks le provocan incomodidad: °¨ Cambie de posición: si está acostada o descansando, camine; si está caminando, descanse. °¨ Siéntese y descanse en una bañera con agua tibia. °¨ Beba 2 o 3 vasos de agua. La deshidratación puede provocar contracciones. °¨ Respire lenta y profundamente varias veces por hora. °¿CUÁNDO DEBO BUSCAR ASISTENCIA MÉDICA INMEDIATA? °Solicite atención médica de inmediato si: °· Las contracciones se intensifican, se hacen más regulares y cercanas entre sí. °· Tiene una pérdida de líquido por la vagina. °· Tiene fiebre. °· Elimina mucosidad manchada con sangre. °· Tiene una hemorragia vaginal abundante. °· Tiene dolor abdominal permanente. °· Tiene un dolor en la zona lumbar que nunca tuvo antes. °· Siente que la cabeza del bebé empuja hacia abajo y ejerce presión en la zona pélvica. °· El bebé no se mueve tanto como solía. °Esta información no tiene como fin reemplazar el   consejo del médico. Asegúrese de hacerle al médico cualquier pregunta que tenga. °Document Released: 05/13/2005 Document Revised: 11/25/2015 Document Reviewed: 05/15/2013 °Elsevier Interactive Patient Education © 2017 [employer] ° °

## 2017-07-14 ENCOUNTER — Ambulatory Visit (INDEPENDENT_AMBULATORY_CARE_PROVIDER_SITE_OTHER): Payer: Self-pay

## 2017-07-14 DIAGNOSIS — Z8751 Personal history of pre-term labor: Secondary | ICD-10-CM

## 2017-07-14 DIAGNOSIS — O09213 Supervision of pregnancy with history of pre-term labor, third trimester: Secondary | ICD-10-CM

## 2017-07-14 MED ORDER — HYDROXYPROGESTERONE CAPROATE 275 MG/1.1ML ~~LOC~~ SOAJ
275.0000 mg | Freq: Once | SUBCUTANEOUS | Status: AC
  Administered 2017-07-14: 275 mg via SUBCUTANEOUS

## 2017-07-14 NOTE — Progress Notes (Signed)
Nurse visit for 17 P injection Injection  given with no problems . Medication was pt supplied.

## 2017-07-14 NOTE — Progress Notes (Signed)
I have reviewed this chart and agree with the RN assessment and management.    K. Meryl Danile Trier, M.D. Attending Obstetrician & Gynecologist, [facility], [facility]  

## 2017-07-23 ENCOUNTER — Ambulatory Visit (INDEPENDENT_AMBULATORY_CARE_PROVIDER_SITE_OTHER): Payer: Self-pay | Admitting: Certified Nurse Midwife

## 2017-07-23 ENCOUNTER — Encounter: Payer: Self-pay | Admitting: Certified Nurse Midwife

## 2017-07-23 ENCOUNTER — Encounter: Payer: Self-pay | Admitting: Obstetrics

## 2017-07-23 VITALS — BP 121/70 | HR 90 | Wt 145.8 lb

## 2017-07-23 DIAGNOSIS — R102 Pelvic and perineal pain: Secondary | ICD-10-CM

## 2017-07-23 DIAGNOSIS — B951 Streptococcus, group B, as the cause of diseases classified elsewhere: Secondary | ICD-10-CM

## 2017-07-23 DIAGNOSIS — O09892 Supervision of other high risk pregnancies, second trimester: Secondary | ICD-10-CM

## 2017-07-23 DIAGNOSIS — O09212 Supervision of pregnancy with history of pre-term labor, second trimester: Secondary | ICD-10-CM

## 2017-07-23 DIAGNOSIS — Z3480 Encounter for supervision of other normal pregnancy, unspecified trimester: Secondary | ICD-10-CM

## 2017-07-23 DIAGNOSIS — Z789 Other specified health status: Secondary | ICD-10-CM

## 2017-07-23 MED ORDER — HYDROXYPROGESTERONE CAPROATE 275 MG/1.1ML ~~LOC~~ SOAJ
275.0000 mg | SUBCUTANEOUS | Status: AC
  Administered 2017-07-23 – 2017-08-06 (×3): 275 mg via SUBCUTANEOUS

## 2017-07-23 NOTE — Progress Notes (Signed)
Pt was given Makena injection at today's visit. Pt tolerated well.   Administrations This Visit    HYDROXYprogesterone Caproate SOAJ 275 mg    Admin Date 07/23/2017 Action Given Dose 275 mg Route Subcutaneous Administered By Lanney GinsFoster, Suzanne D, CMA

## 2017-07-23 NOTE — Progress Notes (Signed)
   PRENATAL VISIT NOTE  Subjective:  Andrea Caldwell is a 26 y.o. G2P0101 at 5180w2d being seen today for ongoing prenatal care.  She is currently monitored for the following issues for this high-risk pregnancy and has Supervision of normal pregnancy, antepartum; Late prenatal care; H/O preterm delivery, currently pregnant, second trimester; Positive GBS test; and Non-English speaking patient on their problem list.  Patient reports no bleeding, no contractions, no cramping, no leaking and vaginal irritation.  Contractions: Not present. Vag. Bleeding: None.  Movement: Present. Denies leaking of fluid.   The following portions of the patient's history were reviewed and updated as appropriate: allergies, current medications, past family history, past medical history, past social history, past surgical history and problem list. Problem list updated.  Objective:   Vitals:   07/23/17 1024  BP: 121/70  Pulse: 90  Weight: 145 lb 12.8 oz (66.1 kg)    Fetal Status:     Movement: Present     General:  Alert, oriented and cooperative. Patient is in no acute distress.  Skin: Skin is warm and dry. No rash noted.   Cardiovascular: Normal heart rate noted  Respiratory: Normal respiratory effort, no problems with respiration noted  Abdomen: Soft, gravid, appropriate for gestational age.  Pain/Pressure: Present     Pelvic: Cervical exam deferred      Does have superficial veins noted in-between labia majora and minora, not varicose, no edema.   Extremities: Normal range of motion.  Edema: Trace  Mental Status:  Normal mood and affect. Normal behavior. Normal judgment and thought content.   Assessment and Plan:  Pregnancy: G2P0101 at 380w2d  1. Supervision of other normal pregnancy, antepartum     Yeast/BV obtained.  - HYDROXYprogesterone Caproate SOAJ 275 mg  2. Positive GBS test     PCN for labor/delivery  3. Non-English speaking patient     Interpreter live used for [employer]  4. H/O  preterm delivery, currently pregnant, second trimester     Continue weekly 17-P.  - HYDROXYprogesterone Caproate SOAJ 275 mg  Preterm labor symptoms and general obstetric precautions including but not limited to vaginal bleeding, contractions, leaking of fluid and fetal movement were reviewed in detail with the patient. Please refer to After Visit Summary for other counseling recommendations.  Return in about 2 weeks (around 08/06/2017) for ROB, 17-P weekly.   Roe Coombsachelle A Dia Donate, CNM

## 2017-07-23 NOTE — Progress Notes (Signed)
Patient reports good fetal movement, complains of left sided and pelvic pain, denies contractions and bleeding.

## 2017-07-27 LAB — CERVICOVAGINAL ANCILLARY ONLY
BACTERIAL VAGINITIS: NEGATIVE
CANDIDA VAGINITIS: NEGATIVE

## 2017-07-30 ENCOUNTER — Ambulatory Visit (INDEPENDENT_AMBULATORY_CARE_PROVIDER_SITE_OTHER): Payer: Self-pay

## 2017-07-30 DIAGNOSIS — O09892 Supervision of other high risk pregnancies, second trimester: Secondary | ICD-10-CM

## 2017-07-30 DIAGNOSIS — O09212 Supervision of pregnancy with history of pre-term labor, second trimester: Secondary | ICD-10-CM

## 2017-07-30 NOTE — Progress Notes (Signed)
Pt here for 17p. Given in left arm. Pt tolerated well.

## 2017-08-06 ENCOUNTER — Ambulatory Visit (INDEPENDENT_AMBULATORY_CARE_PROVIDER_SITE_OTHER): Payer: Self-pay | Admitting: Certified Nurse Midwife

## 2017-08-06 VITALS — BP 119/76 | HR 101 | Wt 150.4 lb

## 2017-08-06 DIAGNOSIS — Z3480 Encounter for supervision of other normal pregnancy, unspecified trimester: Secondary | ICD-10-CM

## 2017-08-06 DIAGNOSIS — Z789 Other specified health status: Secondary | ICD-10-CM

## 2017-08-06 DIAGNOSIS — B951 Streptococcus, group B, as the cause of diseases classified elsewhere: Secondary | ICD-10-CM

## 2017-08-06 DIAGNOSIS — O09892 Supervision of other high risk pregnancies, second trimester: Secondary | ICD-10-CM

## 2017-08-06 DIAGNOSIS — O09212 Supervision of pregnancy with history of pre-term labor, second trimester: Secondary | ICD-10-CM

## 2017-08-06 NOTE — Progress Notes (Signed)
Patient is in the office for ob visit, reports good fetal movement.

## 2017-08-06 NOTE — Progress Notes (Signed)
   PRENATAL VISIT NOTE  Subjective:  Axel FillerLaura Garcia-Espitia is a 26 y.o. G2P0101 at 2636w2d being seen today for ongoing prenatal care.  She is currently monitored for the following issues for this low-risk pregnancy and has Supervision of normal pregnancy, antepartum; Late prenatal care; H/O preterm delivery, currently pregnant, second trimester; Positive GBS test; and Non-English speaking patient on their problem list.  Patient reports no complaints.  Contractions: Not present. Vag. Bleeding: None.  Movement: Present. Denies leaking of fluid.   The following portions of the patient's history were reviewed and updated as appropriate: allergies, current medications, past family history, past medical history, past social history, past surgical history and problem list. Problem list updated.  Objective:   Vitals:   08/06/17 0950  BP: 119/76  Pulse: (!) 101  Weight: 150 lb 6.4 oz (68.2 kg)    Fetal Status: Fetal Heart Rate (bpm): 150; doppler Fundal Height: 35 cm Movement: Present     General:  Alert, oriented and cooperative. Patient is in no acute distress.  Skin: Skin is warm and dry. No rash noted.   Cardiovascular: Normal heart rate noted  Respiratory: Normal respiratory effort, no problems with respiration noted  Abdomen: Soft, gravid, appropriate for gestational age.  Pain/Pressure: Absent     Pelvic: Cervical exam deferred        Extremities: Normal range of motion.  Edema: Trace  Mental Status:  Normal mood and affect. Normal behavior. Normal judgment and thought content.   Assessment and Plan:  Pregnancy: G2P0101 at 7536w2d  1. Supervision of other normal pregnancy, antepartum      Doing well.    2. Positive GBS test      PCN for labor/delivery  3. Non-English speaking patient     Interpreter present for exam  4. H/O preterm delivery, currently pregnant, second trimester     On weekly 17-P     Preterm labor symptoms and general obstetric precautions including but not  limited to vaginal bleeding, contractions, leaking of fluid and fetal movement were reviewed in detail with the patient. Please refer to After Visit Summary for other counseling recommendations.  Return in about 1 week (around 08/13/2017) for ROB.   Roe Coombsachelle A Denney, CNM

## 2017-08-13 ENCOUNTER — Ambulatory Visit (INDEPENDENT_AMBULATORY_CARE_PROVIDER_SITE_OTHER): Payer: Self-pay

## 2017-08-13 VITALS — BP 113/65 | HR 71 | Wt 152.0 lb

## 2017-08-13 DIAGNOSIS — O09212 Supervision of pregnancy with history of pre-term labor, second trimester: Secondary | ICD-10-CM

## 2017-08-13 DIAGNOSIS — O09892 Supervision of other high risk pregnancies, second trimester: Secondary | ICD-10-CM

## 2017-08-13 NOTE — Addendum Note (Signed)
Addended by: Maretta BeesMCGLASHAN, Najee Cowens J on: 08/13/2017 09:52 AM   Modules accepted: Level of Service

## 2017-08-13 NOTE — Progress Notes (Signed)
Presents for 17P injection, given in RUOQ, tolerated well.  Office stock used.  Administrations This Visit    hydroxyprogesterone caproate (MAKENA) 250 mg/mL injection 250 mg    Admin Date 08/13/2017 Action Given Dose 250 mg Route Intramuscular Administered By Maretta BeesMcGlashan, Derrious Bologna J, RMA

## 2017-08-13 NOTE — Progress Notes (Signed)
Agree with nursing staff's documentation of this patient's clinic encounter.  Catalina AntiguaPeggy Brynlie Daza, MD 08/13/2017 11:14 AM

## 2017-08-17 NOTE — L&D Delivery Note (Signed)
Delivery Note At  a viable child was delivered via Vaginal, Spontaneous (Presentation:  LOA ;  ).  APGAR: 8/9 ; weight vpending .   After 1 minute, the cord was clamped and cut. 40 units of pitocin diluted in 1000cc LR was infused rapidly IV.  The placenta separated spontaneously and delivered via CCT and maternal pushing effort.  It was inspected and appears to be intact with a 3 VC.   Anesthesia: epidural  Episiotomy: None Lacerations:  none Suture Repair:  Est. Blood Loss (mL):  100  Mom to postpartum.  Baby to Couplet care / Skin to Skin.  The above was performed by Dr. Sherran NeedsS Abraham under my direct supervision and guidance.    Andrea Caldwell,Andrea Caldwell 08/27/2017, 8:04 AM  Please schedule this patient for PP visit in: 6 weeks Low risk pregnancy complicated by:  Delivery mode:  SVD Anticipated Birth Control:  IUD PP Procedures needed:   Schedule Integrated BH visit: no Provider: Any provider

## 2017-08-20 ENCOUNTER — Encounter: Payer: Self-pay | Admitting: Advanced Practice Midwife

## 2017-08-20 ENCOUNTER — Ambulatory Visit (INDEPENDENT_AMBULATORY_CARE_PROVIDER_SITE_OTHER): Payer: Self-pay | Admitting: Advanced Practice Midwife

## 2017-08-20 VITALS — BP 115/76 | HR 86 | Wt 152.0 lb

## 2017-08-20 DIAGNOSIS — Z113 Encounter for screening for infections with a predominantly sexual mode of transmission: Secondary | ICD-10-CM

## 2017-08-20 DIAGNOSIS — O09892 Supervision of other high risk pregnancies, second trimester: Secondary | ICD-10-CM

## 2017-08-20 DIAGNOSIS — B951 Streptococcus, group B, as the cause of diseases classified elsewhere: Secondary | ICD-10-CM

## 2017-08-20 DIAGNOSIS — O09212 Supervision of pregnancy with history of pre-term labor, second trimester: Secondary | ICD-10-CM

## 2017-08-20 DIAGNOSIS — Z789 Other specified health status: Secondary | ICD-10-CM

## 2017-08-20 DIAGNOSIS — Z3490 Encounter for supervision of normal pregnancy, unspecified, unspecified trimester: Secondary | ICD-10-CM

## 2017-08-20 MED ORDER — HYDROXYPROGESTERONE CAPROATE 275 MG/1.1ML ~~LOC~~ SOAJ
275.0000 mg | Freq: Once | SUBCUTANEOUS | Status: AC
  Administered 2017-08-20: 275 mg via SUBCUTANEOUS

## 2017-08-20 NOTE — Patient Instructions (Addendum)
Infeccin por estreptococo del grupoB durante el embarazo (Group B Streptococcus Infection During Pregnancy) El estreptococo del grupoB es un tipo de bacteria (EGB) (Streptococcus agalactiae) que suele encontrarse en personas saludables, comnmente en el recto, la vagina y los intestinos. En personas saludables y en mujeres no embarazadas, rara vez la bacteria provoca una enfermedad o complicaciones graves. Sin embargo, las mujeres cuya prueba de EGB es positiva durante el embarazo pueden transmitirle la bacteria al beb en el parto; esto puede provocar una infeccin grave en el beb despus del parto. Las mujeres con EGB tambin pueden tener infecciones durante el embarazo o inmediatamente despus del parto, como infecciones de las vas urinarias (IVU) o infecciones del tero (infecciones uterinas). Tener EGB tambin aumenta el riesgo de complicaciones durante el embarazo, como parto o trabajo de parto prematuros, aborto espontneo o muerte fetal. Se recomienda que todas las embarazadas se hagan pruebas (exmenes de deteccin) de rutina para determinar la presencia del estreptococo del grupoB. FACTORES DE RIESGO Puede tener un mayor riesgo de contraer una infeccin por estreptococo del grupoB durante el embarazo si ya le ocurri en un embarazo previo. SNTOMAS En la mayora de los casos, la infeccin por EGB no causa sntomas en las embarazadas. Los signos y sntomas de una posible infeccin relacionada con el EGB pueden incluir los siguientes:  Inicio del trabajo de parto antes de la semana37 de gestacin.  Una infeccin de las vas urinarias o de la vejiga, que puede provocar: ? Fiebre. ? Sensacin de dolor o ardor al orinar. ? Ganas frecuentes de orinar.  Fiebre durante el trabajo de parto, junto con: ? Una secrecin con mal olor. ? Dolor a la palpacin en el tero. ? Frecuencia cardaca acelerada en la madre, en el beb o en ambos. Los sntomas poco frecuentes pero graves de una posible  infeccin relacionada con el EGB en las mujeres incluyen:  Infeccin en la sangre (septicemia). Esto puede provocar fiebre, escalofros o confusin.  Infeccin pulmonar (neumona). Esto puede provocar fiebre, escalofros, tos, respiracin rpida, dificultad para respirar o dolor en el pecho.  Infeccin en los huesos, las articulaciones, la piel o los tejidos blandos. DIAGNSTICO Le pueden realizar exmenes para detectar el EGB entre la semana35 y la semana37 de gestacin. Si tiene sntomas de trabajo de parto prematuro, le pueden realizar los exmenes de deteccin antes. Esta afeccin se diagnostica a travs de los resultados de anlisis de laboratorio de:  Un hisopo con lquido de la vagina y el recto.  Una muestra de orina. TRATAMIENTO Esta enfermedad se trata con antibiticos. Al comenzar el trabajo de parto o apenas rompe bolsa (ruptura de las membranas), le administrarn antibiticos a travs de una va intravenosa (IV). Seguir con antibiticos hasta despus del parto. Si tiene un parto por cesrea, no necesita antibiticos salvo que las membranas ya se hayan roto. INSTRUCCIONES PARA EL CUIDADO EN EL HOGAR  Tome los medicamentos de venta libre y los recetados solamente como se lo haya indicado el mdico.  Tome los antibiticos como se lo haya indicado el mdico. No deje de tomar los antibiticos aunque comience a sentirse mejor.  Concurra a todas las visitas previas al parto (prenatales) y las visitas de control como se lo haya indicado el mdico. Esto es importante. SOLICITE ATENCIN MDICA SI:  Siente dolor o ardor al orinar.  Tiene necesidad de orinar con frecuencia.  Tiene fiebre o siente escalofros.  Tiene una secrecin vaginal con mal olor. SOLICITE ATENCIN MDICA DE INMEDIATO SI:  Se produce   la ruptura de las [place].  Comienza el trabajo de [place].  Siente un dolor intenso en el abdomen.  Tiene dificultad para respirar.  Siente dolor en el pecho. Esta  informacin no tiene Theme park manager el consejo del mdico. Asegrese de hacerle al mdico cualquier pregunta que tenga. Document Released: 01/20/2008 Document Revised: 08/08/2013 Document Reviewed: 02/27/2016 Elsevier Interactive Patient Education  2018 [employer].  Desafos y soluciones para el amamantamiento (Breastfeeding Challenges and Solutions) Aunque el amamantamiento es un proceso natural, puede presentar desafos, en especial durante las primeras semanas despus del nacimiento del beb. Al comenzar a amamantar al nuevo beb, es normal que surjan problemas, incluso si ha Hewlett-[employer]ard. En este documento, se indican algunas soluciones para los desafos ms frecuentes del amamantamiento. DESAFOS Y SOLUCIONES Desafo: pezones irritados o agrietados Es frecuente que las madres que [address] tengan los pezones irritados o Chief Strategy Officer. Los pezones agrietados o irritados a menudo se deben a que la boca del beb se prende de forma inadecuada para Museum/gallery exhibitions officer. Tambin puede haber irritacin si el beb no est ubicado como corresponde [employer]. Si bien es frecuente DIRECTV pezones irritados y agrietados durante la primera semana posterior al nacimiento, el dolor en los pezones no es normal nunca. Si tiene los pezones irritados o agrietados durante ms de [place], o si tiene [employer] Department Stores, llame a su mdico o al Holiday representative. Solucin Siga los siguientes pasos para asegurarse de que el beb se prenda y est ubicado como corresponde:  Encuentre un lugar cmodo para sentarse o Teacher, music, con un buen respaldo para el cuello y la espalda.  Coloque una almohada o una manta enrollada debajo del beb para acomodarlo a la altura de la mama (si est sentada).  Asegrese de que el abdomen del beb est frente al suyo.  Masajee suavemente la mama. Con las yemas de los dedos, masajee la pared del pecho hacia el pezn en un movimiento circular. Esto estimula el flujo de [place]. Es  posible que Engineer, manufacturing systems este movimiento mientras amamanta si la leche fluye lentamente.  Sostenga la mama con el pulgar por arriba del pezn y los otros 4 dedos por debajo de la mama. Asegrese de que los dedos se encuentren lejos del pezn y de la boca del beb.  Empuje suavemente los labios del beb con el pezn o con el dedo.  Cuando la boca del beb se abra lo suficiente, acrquelo rpidamente a la mama e introduzca todo el pezn y la zona oscura que lo rodea (areola), tanto como sea posible, dentro de la boca del beb. ? Debe haber ms areola visible por arriba del labio superior del beb que por debajo del labio inferior. ? La lengua del beb debe estar entre la enca inferior y la [place].  Asegrese de que la boca del beb est en la posicin correcta alrededor del pezn (prendida). Los labios del beb deben crear un sello sobre la mama, doblndose hacia afuera (invertidos).  Es comn que el beb succione durante 2 a 3 minutos para que comience el flujo de [place]. Entre los signos de que el beb se ha prendido Audiological scientist al pezn, se incluyen:  El beb tironea o succiona con tranquilidad, sin causarle dolor.  Se escucha que traga cada 3 o 4 succiones.  Hace movimientos musculares por arriba y por delante de sus odos al Printmaker. Entre los signos de que el beb no se ha prendido Audiological scientist al pezn, se incluyen:  Ruidos de  succin o de Progress Energychasquido mientras amamanta.  Dolor en el pezn. Para asegurarse de Tesoro Corporationtener los pezones humectados y sanos, haga lo siguiente:  No se lave los pezones con Havrejabn.  Use un sostn de soporte. Evite usar sostenes con aro o sostenes ajustados.  Deje secar al aire los pezones durante 3 a 4minutos despus de amamantar al beb.  Utilice solamente almohadillas de algodn en el sostn para absorber las prdidas de [place]leche. La prdida de un poco de Deere & Companyleche materna entre las tomas es normal. Asegrese de Multimedia programmercambiar las almohadillas si se empapan con  [place]leche.  Colquese lanolina United Stationerssobre los pezones despus de Museum/gallery exhibitions officeramamantar. La lanolina ayuda a mantener la humedad normal de la piel. Si Botswanausa lanolina pura, no tiene que lavarse los pezones antes de Corporate treasureralimentar al beb. La lanolina pura no es txica para el beb. Tambin puede extraer Teachers Insurance and Annuity Associationmanualmente algunas gotas de Nadineleche materna, Floridamasajearla con suavidad sobre los pezones y dejar que se seque al aire. Desafo: congestin mamaria La Gap Inccongestin mamaria se produce cuando las [address] se llenan excesivamente de Williamsdaleleche. Las primeras semanas despus del nacimiento, usted puede tener congestin Disautelmamaria. La congestin International Business Machinesmamaria puede hacer que las mamas latan y estn duras, muy tirantes, calientes y sensibles. La congestin llega a un pico mximo alrededor del quinto da despus del nacimiento. El hecho de tener congestin mamaria no significa que deba dejar de Museum/gallery exhibitions officeramamantar al beb. Solucin  Alimente al beb cuando muestre signos de hambre o si siente la necesidad de reducir la congestin de las Oxoboxo Rivermamas. Esto se denomina "lactancia a demanda".  Los recin nacidos (bebs de menos de 4semanas de vida) a menudo se alimentan cada 1 a 3 horas Administratordurante el da. Es posible que deba despertar al beb si est durmiendo cuando es hora de Kendletonamamantarlo.  No deje que el beb duerma ms de 5horas durante la noche sin amamantarlo.  Extraiga leche de forma manual o con un sacaleche antes de amamantar para ablandar la mama, la areola y el pezn.  Aplique calor hmedo (en la ducha o con toallas de mano humedecidas con agua tibia) justo antes de amamantar o de extraer Peterstownleche, o masajee la mama antes de amamantar o mientras lo hace. Esto aumenta la circulacin y Saint Vincent and the Grenadinesayuda a que la Hill Cityleche fluya.  Vace por completo las mamas al QUALCOMMamamantar o extraer Dardenne Prairieleche. Despus, use un sostn tipo deportivo (para amamantar o comn) o una camiseta sin mangas durante 1 o 2 das para indicarle al cuerpo que disminuya ligeramente la produccin de Placervilleleche. Use solamente un sostn  tipo deportivo o una camiseta sin mangas para tratar la congestin. Las M.D.C. Holdingsmadres que amamantan habitualmente deben evitar los sostenes ajustados. Una vez que la congestin se Robinsonalivie, vuelva a usar ropa [place] Dorado Hillscomn, Carrollfloja.  Aplquese compresas de hielo en las mamas para reducir Chief Technology Officerel dolor de la congestin y Technical sales engineeraliviar la hinchazn, excepto si el hielo le resulta incmodo.  No retrase los horarios de amamantamiento. Intente relajarse cuando sea la hora de alimentar al beb. Esto ayuda a Licensed conveyanceractivar el "reflejo de bajada", que hace que la Newportleche comience a salir de Educational psychologistla mama.  Asegrese de que el beb est bien prendido a la mama y que se encuentre en la posicin correcta mientras lo Sykesvillealimenta.  Deje que el beb permanezca en la mama todo el tiempo que est prendido bien y que succione activamente. El beb le har saber que ha terminado de alimentarse cuando se desprenda de la mama o se quede dormido.  Evite darle biberones o chupetes  al beb en las primeras semanas de amamantamiento. Para introducir estos elementos, espere hasta despus de resolver cualquier desafo con el amamantamiento.  Si regresa al Aleen Campi o est fuera de su casa durante un perodo prolongado, trate de Marine scientist en el mismo horario que debera amamantar al beb.  Tome mucho lquido para Statistician, que con el tiempo puede aumentar el riesgo de [place]. Si sigue estas indicaciones, la congestin debe mejorar en 24 a 48 horas. Si an tiene dificultades, llame a su mdico o al Holiday representative. Desafo: conductos galactforos obstruidos Los conductos galactforos se obstruyen cuando no drenan la leche como corresponde y, en consecuencia, se hinchan. El uso de un sostn para Museum/gallery exhibitions officer ajustado o las dificultades para que el beb se prenda pueden causar la obstruccin de estos conductos. El hecho de no tomar la cantidad suficiente de agua (de 8 a 10vasos [1,9 a 2,4l] por Futures trader) puede contribuir a que los conductos  galactforos se obstruyan. Una vez que un conducto se obstruye, puede tener bultos duros, irritacin y Copy. Solucin No retrase los horarios de Librarian, academic. Alimente a su beb con frecuencia y trate de vaciar sus pechos cada vez que lo amamanta. Trate de alimentarlo primero del lado afectado ya que habr ms probabilidad de que se vace por completo ese pecho. Aplique toallas hmedas calientes durante 5 a antes de amamantar. De forma alternativa, una ducha caliente justo antes de Museum/gallery exhibitions officer puede ofrecer el calor hmedo que estimula el flujo de [place]. Tambin puede ser de [employer] Industries suavemente la zona irritada antes de Museum/gallery exhibitions officer y Sims est [place]. Evite usar ropa [place] o sostenes que presionen las [place]. Use sostenes que sostengan bien las [place], pero evite usar sostenes con arco. Si se le obstruye un conducto galactfero y tiene [place], debe ver a su mdico. Desafo: mastitis La mastitis es la inflamacin de la mama. Por lo general, est causada por una infeccin bacteriana y puede provocar sntomas similares a los de Emergency planning/management officer. Puede tener enrojecimiento de la mama y [place]. A menudo, cuando hay una mastitis, la mama est dura y caliente, y duele mucho. Las causas ms frecuentes de la mastitis son la mala prendida o la succin inadecuada del beb, presin constante en la mama (posiblemente por usar un sostn ajustado o una camisa que limite el flujo de [place]), estrs o fatiga anormales, o no Museum/gallery exhibitions officer con regularidad. Solucin Le recetarn un antibitico para tratar la infeccin. Sigue siendo importante que amamante con frecuencia para vaciar las [place]. Seguir [employer] se recupera de la mastitis no le har dao al beb. Asegrese de que el beb est bien ubicado cada vez que lo [place]. Aplique calor hmedo en las mamas durante unos minutos antes de amamantar para ayudar al flujo de la [place] y a que las mamas se vacen ms fcilmente. Desafo:  candidiasis La candidiasis es una infeccin por hongos que se forma en los pezones, en la mama o en la boca del beb. Causa picazn, dolor, ardor o dolor punzante, y a veces una erupcin. Solucin Le recetarn un ungento para los [place], y su beb recibir un medicamento lquido para Government social research officer. Es importante que usted y el beb reciban tratamiento de forma simultnea, porque pueden transmitirse la candidiasis entre ustedes. Cambie los apsitos mamarios desechables con frecuencia. Cada da, debe lavar con agua muy caliente cualquier sostn, toalla o ropa que entre en contacto con las reas infectadas de su cuerpo o del cuerpo del beb.  Lvese las manos y lave las manos del beb con frecuencia. Hierva durante 20 minutos, una vez al da, todos los chupetes, las tetinas de los biberones y los juguetes que el beb se lleve a la boca. Despus de 1 semana de tratamiento, deseche los chupetes y las tetinas de los biberones y compre unos nuevos. Debe hervir durante 20 minutos, todos los [place], todos los componentes del sacaleche que estn en contacto con la [place]. Desafo: poca produccin de [employer] beb no aumenta de peso como corresponde, es posible que usted no est produciendo [employer] Airways. La produccin de Colgate Palmolive est basada en un sistema de oferta y [place]. La produccin de W[employer] Resources depende de la frecuencia y la eficacia con la que el beb vaca la mama. Solucin Cuanto ms amamante y se extraiga [place], ms Comcast. Es importante que el beb vace al menos una de las mamas en cada alimentacin. Si no es as, use un sacaleche o extraiga la leche restante de forma manual. Esto ayudar a drenar la mayor cantidad de [place] posible cada vez que amamanta. Tambin ser Merrill Lynch para que el cuerpo produzca ms [place]. Si el beb no le est vaciando las [address], esto puede deberse a problemas de prendida, succin o ubicacin. Si sigue teniendo poca leche despus de American Standard Companies, comunquese  con su mdico o con un especialista en lactancia lo antes posible. Desafo: pezones invertidos o planos En algunas mujeres, los pezones estn metidos hacia adentro en vez de sobresalir. Otras mujeres tiene pezones planos. Los pezones invertidos o planos a veces pueden hacer que el beb tenga ms dificultades para prenderse a la mama. Solucin Le darn un pequeo dispositivo que saca hacia afuera los pezones invertidos. Este dispositivo debe colocarse inmediatamente antes de poner al beb en la mama. Tambin puede tratar de usar Oceanographer durante un corto tiempo antes de colocar al beb en la mama. El sacaleche puede sacar el pezn hacia afuera para ayudar a su beb a que se agarre con mayor facilidad. Tambin la succin del beb ayudar a que el pezn invertido sobresalga. Si tiene los pezones planos, estimule al beb para que se prenda a la mama y Technical sales engineer con frecuencia los primeros das despus del nacimiento. Esto le dar la prctica para que se prenda correctamente mientras la mama est blanda. Cuando la produccin de leche aumente entre el segundo y el quinto da despus del parto y las mamas se llenen, el beb se prender ms fcilmente. Si sigue teniendo inquietudes, comunquese con Press photographer, quien podr ensearle tcnicas adicionales para resolver los problemas de amamantamiento relacionados con la posicin y la forma del pezn. PARA OBTENER MS INFORMACIN Liga internacional La Leche: [email or web address] Esta informacin no tiene como fin reemplazar el consejo del mdico. Asegrese de hacerle al mdico cualquier pregunta que tenga. Document Released: 01/20/2008 Document Revised: 08/24/2014 Document Reviewed: 01/27/2013 Elsevier Interactive Patient Education  2017 [employer].

## 2017-08-20 NOTE — Addendum Note (Signed)
Addended by: Tim LairLARK, Dashonna Chagnon on: 08/20/2017 08:52 AM   Modules accepted: Orders

## 2017-08-20 NOTE — Progress Notes (Addendum)
   PRENATAL VISIT NOTE  Subjective:  Axel FillerLaura Caldwell is a 27 y.o. G2P0101 at 6066w2d being seen today for ongoing prenatal care.  She is currently monitored for the following issues for this high-risk pregnancy and has Supervision of normal pregnancy, antepartum; Late prenatal care; H/O preterm delivery, currently pregnant, second trimester; Positive GBS test; and Non-English speaking patient on their problem list.  Patient reports occasional contractions.  Contractions: Not present. Vag. Bleeding: None.  Movement: Present. Denies leaking of fluid.   The following portions of the patient's history were reviewed and updated as appropriate: allergies, current medications, past family history, past medical history, past social history, past surgical history and problem list. Problem list updated.  Live spanish interpreter used.  Objective:   Vitals:   08/20/17 0820  BP: 115/76  Pulse: 86  Weight: 152 lb (68.9 kg)    Fetal Status: Fetal Heart Rate (bpm): 146 Fundal Height: 37 cm Movement: Present  Presentation: Vertex  General:  Alert, oriented and cooperative. Patient is in no acute distress.  Skin: Skin is warm and dry. No rash noted.   Cardiovascular: Normal heart rate noted  Respiratory: Normal respiratory effort, no problems with respiration noted  Abdomen: Soft, gravid, appropriate for gestational age.  Pain/Pressure: Absent     Pelvic: Cervical exam deferred        Extremities: Normal range of motion.  Edema: Trace  Mental Status:  Normal mood and affect. Normal behavior. Normal judgment and thought content.   Assessment and Plan:  Pregnancy: G2P0101 at 6966w2d  1. Encounter for supervision of normal pregnancy, antepartum, unspecified gravidity  - [facility]lam[facility]  2. GBS pos in urine  - PCN labor  3. x PTD  - Completed 17-P today  Preterm labor symptoms and general obstetric precautions including but not limited to vaginal bleeding,  contractions, leaking of fluid and fetal movement were reviewed in detail with the patient. Please refer to After Visit Summary for other counseling recommendations.  Return in about 1 week (around 08/27/2017) for ROB.   Andrea KinsmanVirginia Lani Caldwell, CNM

## 2017-08-20 NOTE — Progress Notes (Signed)
Pt c/o itching at belly button.

## 2017-08-23 LAB — GC/CHLAMYDIA PROBE AMP (~~LOC~~) NOT AT ARMC
CHLAMYDIA, DNA PROBE: NEGATIVE
Neisseria Gonorrhea: NEGATIVE

## 2017-08-27 ENCOUNTER — Inpatient Hospital Stay (HOSPITAL_COMMUNITY)
Admission: AD | Admit: 2017-08-27 | Discharge: 2017-08-29 | DRG: 807 | Disposition: A | Discharge status: Home / Self Care | Payer: Medicaid Other | Source: Ambulatory Visit | Attending: Obstetrics & Gynecology | Admitting: Obstetrics & Gynecology

## 2017-08-27 ENCOUNTER — Encounter (HOSPITAL_COMMUNITY): Payer: Self-pay | Admitting: *Deleted

## 2017-08-27 ENCOUNTER — Inpatient Hospital Stay (HOSPITAL_COMMUNITY): Payer: Medicaid Other | Admitting: Anesthesiology

## 2017-08-27 ENCOUNTER — Encounter: Payer: Self-pay | Admitting: Certified Nurse Midwife

## 2017-08-27 DIAGNOSIS — Z3490 Encounter for supervision of normal pregnancy, unspecified, unspecified trimester: Secondary | ICD-10-CM

## 2017-08-27 DIAGNOSIS — O99824 Streptococcus B carrier state complicating childbirth: Principal | ICD-10-CM | POA: Diagnosis present

## 2017-08-27 DIAGNOSIS — Z3A37 37 weeks gestation of pregnancy: Secondary | ICD-10-CM

## 2017-08-27 DIAGNOSIS — Z3483 Encounter for supervision of other normal pregnancy, third trimester: Secondary | ICD-10-CM | POA: Diagnosis present

## 2017-08-27 LAB — TYPE AND SCREEN
ABO/RH(D): A POS
ANTIBODY SCREEN: NEGATIVE

## 2017-08-27 LAB — CBC
HCT: 33.1 % — ABNORMAL LOW (ref 36.0–46.0)
HEMOGLOBIN: 11 g/dL — AB (ref 12.0–15.0)
MCH: 26.6 pg (ref 26.0–34.0)
MCHC: 33.2 g/dL (ref 30.0–36.0)
MCV: 80.1 fL (ref 78.0–100.0)
Platelets: 242 10*3/uL (ref 150–400)
RBC: 4.13 MIL/uL (ref 3.87–5.11)
RDW: 13.7 % (ref 11.5–15.5)
WBC: 9.6 10*3/uL (ref 4.0–10.5)

## 2017-08-27 LAB — RPR: RPR Ser Ql: NONREACTIVE

## 2017-08-27 LAB — OB RESULTS CONSOLE GBS: GBS: POSITIVE

## 2017-08-27 MED ORDER — TETANUS-DIPHTH-ACELL PERTUSSIS 5-2.5-18.5 LF-MCG/0.5 IM SUSP: 0.5000 mL | Freq: Once | INTRAMUSCULAR | Status: DC

## 2017-08-27 MED ORDER — HYDROXYZINE HCL 50 MG PO TABS
50.0000 mg | ORAL_TABLET | Freq: Four times a day (QID) | ORAL | Status: DC | PRN
  Filled 2017-08-27: qty 1

## 2017-08-27 MED ORDER — PHENYLEPHRINE 40 MCG/ML (10ML) SYRINGE FOR IV PUSH (FOR BLOOD PRESSURE SUPPORT)
80.0000 ug | PREFILLED_SYRINGE | INTRAVENOUS | Status: DC | PRN
  Filled 2017-08-27: qty 5

## 2017-08-27 MED ORDER — BENZOCAINE-MENTHOL 20-0.5 % EX AERO: 1.0000 "application " | INHALATION_SPRAY | CUTANEOUS | Status: DC | PRN

## 2017-08-27 MED ORDER — METHYLERGONOVINE MALEATE 0.2 MG/ML IJ SOLN: 0.2000 mg | INTRAMUSCULAR | Status: DC | PRN

## 2017-08-27 MED ORDER — LACTATED RINGERS IV SOLN
500.0000 mL | Freq: Once | INTRAVENOUS | Status: AC
  Administered 2017-08-27: 500 mL via INTRAVENOUS

## 2017-08-27 MED ORDER — EPHEDRINE 5 MG/ML INJ
10.0000 mg | INTRAVENOUS | Status: DC | PRN
  Filled 2017-08-27: qty 2

## 2017-08-27 MED ORDER — PENICILLIN G POTASSIUM 5000000 UNITS IJ SOLR
5.0000 10*6.[IU] | Freq: Once | INTRAVENOUS | Status: AC
  Administered 2017-08-27: 5 10*6.[IU] via INTRAVENOUS
  Filled 2017-08-27: qty 5

## 2017-08-27 MED ORDER — OXYCODONE-ACETAMINOPHEN 5-325 MG PO TABS: 1.0000 | ORAL_TABLET | ORAL | Status: DC | PRN

## 2017-08-27 MED ORDER — FENTANYL 2.5 MCG/ML BUPIVACAINE 1/10 % EPIDURAL INFUSION (WH - ANES): 14.0000 mL/h | INTRAMUSCULAR | Status: DC | PRN

## 2017-08-27 MED ORDER — ONDANSETRON HCL 4 MG/2ML IJ SOLN: 4.0000 mg | INTRAMUSCULAR | Status: DC | PRN

## 2017-08-27 MED ORDER — SOD CITRATE-CITRIC ACID 500-334 MG/5ML PO SOLN: 30.0000 mL | ORAL | Status: DC | PRN

## 2017-08-27 MED ORDER — FENTANYL 2.5 MCG/ML BUPIVACAINE 1/10 % EPIDURAL INFUSION (WH - ANES)
INTRAMUSCULAR | Status: DC | PRN
  Administered 2017-08-27: 14 mL/h via EPIDURAL

## 2017-08-27 MED ORDER — BISACODYL 10 MG RE SUPP: 10.0000 mg | Freq: Every day | RECTAL | Status: DC | PRN

## 2017-08-27 MED ORDER — LACTATED RINGERS IV SOLN
INTRAVENOUS | Status: DC
  Administered 2017-08-27 (×2): via INTRAVENOUS

## 2017-08-27 MED ORDER — WITCH HAZEL-GLYCERIN EX PADS: 1.0000 "application " | MEDICATED_PAD | CUTANEOUS | Status: DC | PRN

## 2017-08-27 MED ORDER — LIDOCAINE HCL (PF) 1 % IJ SOLN
30.0000 mL | INTRAMUSCULAR | Status: DC | PRN
  Filled 2017-08-27: qty 30

## 2017-08-27 MED ORDER — FLEET ENEMA 7-19 GM/118ML RE ENEM: 1.0000 | ENEMA | Freq: Every day | RECTAL | Status: DC | PRN

## 2017-08-27 MED ORDER — OXYTOCIN BOLUS FROM INFUSION
500.0000 mL | Freq: Once | INTRAVENOUS | Status: AC
  Administered 2017-08-27: 500 mL via INTRAVENOUS

## 2017-08-27 MED ORDER — OXYCODONE HCL 5 MG PO TABS: 5.0000 mg | ORAL_TABLET | ORAL | Status: DC | PRN

## 2017-08-27 MED ORDER — OXYCODONE-ACETAMINOPHEN 5-325 MG PO TABS: 2.0000 | ORAL_TABLET | ORAL | Status: DC | PRN

## 2017-08-27 MED ORDER — FENTANYL CITRATE (PF) 100 MCG/2ML IJ SOLN: 50.0000 ug | INTRAMUSCULAR | Status: DC | PRN

## 2017-08-27 MED ORDER — PHENYLEPHRINE 40 MCG/ML (10ML) SYRINGE FOR IV PUSH (FOR BLOOD PRESSURE SUPPORT)
80.0000 ug | PREFILLED_SYRINGE | INTRAVENOUS | Status: DC | PRN
  Administered 2017-08-27: 80 ug via INTRAVENOUS
  Filled 2017-08-27: qty 5

## 2017-08-27 MED ORDER — ONDANSETRON HCL 4 MG/2ML IJ SOLN: 4.0000 mg | Freq: Four times a day (QID) | INTRAMUSCULAR | Status: DC | PRN

## 2017-08-27 MED ORDER — IBUPROFEN 600 MG PO TABS
600.0000 mg | ORAL_TABLET | Freq: Four times a day (QID) | ORAL | Status: DC
  Administered 2017-08-27 – 2017-08-29 (×9): 600 mg via ORAL
  Filled 2017-08-27 (×9): qty 1

## 2017-08-27 MED ORDER — SIMETHICONE 80 MG PO CHEW: 80.0000 mg | CHEWABLE_TABLET | ORAL | Status: DC | PRN

## 2017-08-27 MED ORDER — PENICILLIN G POT IN DEXTROSE 60000 UNIT/ML IV SOLN
3.0000 10*6.[IU] | INTRAVENOUS | Status: DC
  Administered 2017-08-27: 3 10*6.[IU] via INTRAVENOUS
  Filled 2017-08-27 (×2): qty 50

## 2017-08-27 MED ORDER — MEASLES, MUMPS & RUBELLA VAC ~~LOC~~ INJ
0.5000 mL | INJECTION | Freq: Once | SUBCUTANEOUS | Status: DC
  Filled 2017-08-27: qty 0.5

## 2017-08-27 MED ORDER — DIPHENHYDRAMINE HCL 50 MG/ML IJ SOLN
12.5000 mg | INTRAMUSCULAR | Status: DC | PRN
Start: 2017-08-27 — End: 2017-08-27

## 2017-08-27 MED ORDER — FERROUS SULFATE 325 (65 FE) MG PO TABS
325.0000 mg | ORAL_TABLET | Freq: Two times a day (BID) | ORAL | Status: DC
  Administered 2017-08-27 – 2017-08-29 (×4): 325 mg via ORAL
  Filled 2017-08-27 (×4): qty 1

## 2017-08-27 MED ORDER — METHYLERGONOVINE MALEATE 0.2 MG PO TABS: 0.2000 mg | ORAL_TABLET | ORAL | Status: DC | PRN

## 2017-08-27 MED ORDER — ZOLPIDEM TARTRATE 5 MG PO TABS: 5.0000 mg | ORAL_TABLET | Freq: Every evening | ORAL | Status: DC | PRN

## 2017-08-27 MED ORDER — PRENATAL MULTIVITAMIN CH
1.0000 | ORAL_TABLET | Freq: Every day | ORAL | Status: DC
  Administered 2017-08-28 – 2017-08-29 (×2): 1 via ORAL
  Filled 2017-08-27 (×2): qty 1

## 2017-08-27 MED ORDER — OXYCODONE HCL 5 MG PO TABS: 10.0000 mg | ORAL_TABLET | ORAL | Status: DC | PRN

## 2017-08-27 MED ORDER — FENTANYL 2.5 MCG/ML BUPIVACAINE 1/10 % EPIDURAL INFUSION (WH - ANES)
INTRAMUSCULAR | Status: AC
  Filled 2017-08-27: qty 100

## 2017-08-27 MED ORDER — LIDOCAINE HCL (PF) 1 % IJ SOLN
INTRAMUSCULAR | Status: DC | PRN
  Administered 2017-08-27: 6 mL
  Administered 2017-08-27: 4 mL

## 2017-08-27 MED ORDER — DOCUSATE SODIUM 100 MG PO CAPS
100.0000 mg | ORAL_CAPSULE | Freq: Two times a day (BID) | ORAL | Status: DC
  Administered 2017-08-27 – 2017-08-29 (×4): 100 mg via ORAL
  Filled 2017-08-27 (×4): qty 1

## 2017-08-27 MED ORDER — PHENYLEPHRINE 40 MCG/ML (10ML) SYRINGE FOR IV PUSH (FOR BLOOD PRESSURE SUPPORT)
PREFILLED_SYRINGE | INTRAVENOUS | Status: AC
  Filled 2017-08-27: qty 20

## 2017-08-27 MED ORDER — ACETAMINOPHEN 325 MG PO TABS: 650.0000 mg | ORAL_TABLET | ORAL | Status: DC | PRN

## 2017-08-27 MED ORDER — DIBUCAINE 1 % RE OINT: 1.0000 "application " | TOPICAL_OINTMENT | RECTAL | Status: DC | PRN

## 2017-08-27 MED ORDER — DIPHENHYDRAMINE HCL 25 MG PO CAPS: 25.0000 mg | ORAL_CAPSULE | Freq: Four times a day (QID) | ORAL | Status: DC | PRN

## 2017-08-27 MED ORDER — ONDANSETRON HCL 4 MG PO TABS: 4.0000 mg | ORAL_TABLET | ORAL | Status: DC | PRN

## 2017-08-27 MED ORDER — LACTATED RINGERS IV SOLN: 500.0000 mL | INTRAVENOUS | Status: DC | PRN

## 2017-08-27 MED ORDER — COCONUT OIL OIL
1.0000 "application " | TOPICAL_OIL | Status: DC | PRN
  Filled 2017-08-27: qty 120

## 2017-08-27 MED ORDER — OXYTOCIN 40 UNITS IN LACTATED RINGERS INFUSION - SIMPLE MED
2.5000 [IU]/h | INTRAVENOUS | Status: DC
  Filled 2017-08-27: qty 1000

## 2017-08-27 MED ORDER — FLEET ENEMA 7-19 GM/118ML RE ENEM: 1.0000 | ENEMA | RECTAL | Status: DC | PRN

## 2017-08-27 NOTE — Lactation Note (Signed)
This note was copied from a baby's chart. Lactation Consultation Note  Patient Name: Andrea Caldwell FTDDU'KToday's Date: 08/27/2017 Reason for consult: Initial assessment;Early term 2237-38.6wks  Visited with P2 Mom of 123w2d infant.  Baby 10 hrs old and has been sleepy.  Reassured Mom that this was normal.   Baby swaddled and rooting while Mom trying to latch baby in cradle hold.  Offered to assist with positioning and latching baby. Unwrapped with explanation on benefits of STS. Sat Mom up more upright, and added pillows to support baby at breast level.  Assisted with cross cradle hold.  Hand expression demonstrated and colostrum easily expressed.  Baby latched easily.  Nutritive sucking and swallowing identified. Encouraged STS and feeding often on cue.  Goal of 8-12 feedings per 24 hrs.   Lactation brochure given to Mom.  Mom aware of OP lactation services available to her.   Encouraged her to call prn.    Maternal Data Formula Feeding for Exclusion: Yes Reason for exclusion: Mother's choice to formula and breast feed on admission Has patient been taught Hand Expression?: Yes Does the patient have breastfeeding experience prior to this delivery?: Yes  Feeding Feeding Type: Breast Fed Length of feed: 0 min  LATCH Score Latch: Grasps breast easily, tongue down, lips flanged, rhythmical sucking.  Audible Swallowing: Spontaneous and intermittent  Type of Nipple: Everted at rest and after stimulation  Comfort (Breast/Nipple): Soft / non-tender  Hold (Positioning): Assistance needed to correctly position infant at breast and maintain latch.  LATCH Score: 9  Interventions Interventions: Breast feeding basics reviewed;Assisted with latch;Skin to skin;Breast massage;Hand express;Breast compression;Adjust position;Support pillows;Position options;Expressed milk Consult Status Consult Status: Follow-up Date: 08/28/17 Follow-up type: In-patient    Andrea Caldwell, Zanyiah Posten E 08/27/2017,  6:22 PM

## 2017-08-27 NOTE — Anesthesia Procedure Notes (Signed)
Epidural Patient location during procedure: OB  Staffing Anesthesiologist: Analilia Geddis, MD  Preanesthetic Checklist Completed: patient identified, pre-op evaluation, timeout performed, IV checked, risks and benefits discussed and monitors and equipment checked  Epidural Patient position: sitting Prep: DuraPrep Patient monitoring: blood pressure and continuous pulse ox Approach: right paramedian Location: L3-L4 Injection technique: LOR air  Needle:  Needle type: Tuohy  Needle gauge: 17 G Needle insertion depth: 4 cm Catheter type: closed end flexible Catheter size: 19 Gauge Catheter at skin depth: 10 cm Test dose: negative  Assessment Sensory level: T8  Additional Notes  Dosing of Epidural:  1st dose, through catheter .............................................  Xylocaine 40 mg  2nd dose, through catheter, after waiting 3 minutes.........Xylocaine 60 mg    As each dose occurred, patient was free of IV sx; and patient exhibited no evidence of SA injection.  Patient is more comfortable after epidural dosed. Please see RN's note for documentation of vital signs,and FHR which are stable.  Patient reminded not to try to ambulate with numb legs, and that an RN must be present when she attempts to get up.           

## 2017-08-27 NOTE — MAU Note (Signed)
Pt reports ctx since 3 pm about 10 min apart. Notice small amount of blood when wiping. Good fetal movement felt.

## 2017-08-27 NOTE — H&P (Signed)
LABOR AND DELIVERY ADMISSION HISTORY AND PHYSICAL NOTE  Andrea FillerLaura Caldwell is a 27 y.o. female 272P0101 with IUP at 5181w2d by LMP presenting for SOL.  She reports positive fetal movement. She reports some leakage of fluid and vaginal bleeding.  Prenatal History/Complications: PNC at [facility] Pregnancy complications:  - Positive GBS - late prenatal care   Past Medical History: Past Medical History:  Diagnosis Date  . Headache   . Preterm labor    delivered 1 month early with first baby    Past Surgical History: Past Surgical History:  Procedure Laterality Date  . NO PAST SURGERIES      Obstetrical History: OB History    Gravida Para Term Preterm AB Living   2 1   1   1    SAB TAB Ectopic Multiple Live Births         0 1      Social History: Social History   Socioeconomic History  . Marital status: Single    Spouse name: None  . Number of children: None  . Years of education: None  . Highest education level: None  Social Needs  . Financial resource strain: None  . Food insecurity - worry: None  . Food insecurity - inability: None  . Transportation needs - medical: None  . Transportation needs - non-medical: None  Occupational History  . None  Tobacco Use  . Smoking status: Never Smoker  . Smokeless tobacco: Never Used  Substance and Sexual Activity  . Alcohol use: No  . Drug use: No  . Sexual activity: Yes    Partners: Male    Birth control/protection: None  Other Topics Concern  . None  Social History Narrative  . None    Family History: Family History  Problem Relation Age of Onset  . Cancer Mother   . Hyperlipidemia Father   . Heart disease Father   . Diabetes Maternal Grandmother   . Kidney disease Maternal Aunt   . Diabetes Paternal Uncle     Allergies: No Known Allergies  Facility-Administered Medications Prior to Admission  Medication Dose Route Frequency Provider Last Rate Last Dose  . hydroxyprogesterone caproate (MAKENA) 250  mg/mL injection 250 mg  250 mg Intramuscular Weekly Denney, Rachelle A, CNM   250 mg at 08/13/17 0831   Medications Prior to Admission  Medication Sig Dispense Refill Last Dose  . Prenatal Vit-Fe Fumarate-FA (PRENATAL MULTIVITAMIN) TABS tablet Take 1 tablet by mouth daily at 12 noon.   Taking     Review of Systems  All systems reviewed and negative except as stated in HPI  Physical Exam Blood pressure 110/67, pulse 97, temperature 98.6 F (37 C), temperature source Oral, resp. rate 16, height 5\' 4"  (1.626 m), weight 70.8 kg (156 lb), last menstrual period 12/09/2016, currently breastfeeding. General appearance: alert, oriented, NAD Lungs: normal respiratory effort Heart: regular rate Abdomen: soft, non-tender; gravid, FH appropriate for GA Extremities: No calf swelling or tenderness Presentation: cephalic Cervix: 4.0JW7.5cm dilated Fetal monitoring: FHR 160, moderate variability, +accel, 1 late decel Uterine activity: irregular, every 4-126min Dilation: 8 Effacement (%): 90 Station: -1 Exam by:: Clare CharonKristin Hutchison, RN  Prenatal labs: ABO, Rh: A/Positive/-- (08/17 1101) Antibody: Negative (08/17 1101) Rubella: 8.58 (08/17 1101) RPR: Non Reactive (11/09 1020)  HBsAg: Negative (08/17 1101)  HIV: Non Reactive (11/09 1020)  GC/Chlamydia: negative GBS:  positive   2-hr GTT: 115 Genetic screening:  CF negative  Anatomy US: normal female fetus  Prenatal Transfer Tool  Maternal Diabetes: No  Genetic Screening: Declined Maternal Ultrasounds/Referrals: Normal Fetal Ultrasounds or other Referrals:  None Maternal Substance Abuse:  No Significant Maternal Medications:  None Significant Maternal Lab Results: Lab values include: Group B Strep positive  Results for orders placed or performed during the hospital encounter of 08/27/17 (from the past 24 hour(s))  CBC   Collection Time: 08/27/17  2:32 AM  Result Value Ref Range   WBC 9.6 4.0 - 10.5 K/uL   RBC 4.13 3.87 - 5.11 MIL/uL    Hemoglobin 11.0 (L) 12.0 - 15.0 g/dL   HCT 16.1 (L) 09.6 - 04.5 %   MCV 80.1 78.0 - 100.0 fL   MCH 26.6 26.0 - 34.0 pg   MCHC 33.2 30.0 - 36.0 g/dL   RDW 40.9 81.1 - 91.4 %   Platelets 242 150 - 400 K/uL    Patient Active Problem List   Diagnosis Date Noted  . Normal labor 08/27/2017  . Non-English speaking patient 04/30/2017  . Positive GBS test 04/07/2017  . H/O preterm delivery, currently pregnant, second trimester 04/05/2017  . Supervision of normal pregnancy, antepartum 04/02/2017  . Late prenatal care 04/02/2017    Assessment: Andrea Caldwell is a 27 y.o. G2P0101 at [redacted]w[redacted]d here for SOL  #Labor: anticipate NSVD #Pain: Per patient request, epidural  #FWB: Cat 1  #ID:  GBS positive - administer PCN  #MOF: breast and bottle  #MOC:deciding between IUD and Nexplanon  #Circ:  n/a  Oralia Manis 08/27/2017, 4:48 AM   I spoke with and examined patient and agree with resident/PA/SNM's note and plan of care.  Cathie Beams, CNM 08/27/2017 5:43 AM

## 2017-08-27 NOTE — Anesthesia Preprocedure Evaluation (Signed)

## 2017-08-27 NOTE — Anesthesia Postprocedure Evaluation (Signed)
Anesthesia Post Note  Patient: Andrea FillerLaura Garcia-Espitia  Procedure(s) Performed: AN AD HOC LABOR EPIDURAL     Patient location during evaluation: Mother Baby Anesthesia Type: Epidural Level of consciousness: awake and alert Pain management: pain level controlled Vital Signs Assessment: post-procedure vital signs reviewed and stable Respiratory status: spontaneous breathing, nonlabored ventilation and respiratory function stable Cardiovascular status: stable Postop Assessment: no headache, no backache, epidural receding, no apparent nausea or vomiting, patient able to bend at knees and adequate PO intake Anesthetic complications: no    Last Vitals:  Vitals:   08/27/17 1030 08/27/17 1130  BP: (!) 124/54 (!) 117/44  Pulse: 74 74  Resp: 18 18  Temp: 37 C 37.2 C  SpO2:  99%    Last Pain:  Vitals:   08/27/17 1130  TempSrc: Oral  PainSc: 0-No pain   Pain Goal: Patients Stated Pain Goal: 0 (08/27/17 0222)               Laban EmperorMalinova,Avian Greenawalt Hristova

## 2017-08-28 LAB — BIRTH TISSUE RECOVERY COLLECTION (PLACENTA DONATION)

## 2017-08-28 NOTE — Progress Notes (Signed)
Interpretor#: [id number]  Post Partum Day 1, SVD @8 :05am on 08/27/17.  Subjective: no complaints, + flatus and has not gotten up much since delivery, only to bathroom and back. Patient noticing some small clots but otherwise normal bleeding.   Objective: Blood pressure 114/67, pulse 62, temperature 98.2 F (36.8 C), temperature source Oral, resp. rate 18, height 5\' 4"  (1.626 m), weight 66.2 kg (146 lb), last menstrual period 12/09/2016, SpO2 99 %, unknown if currently breastfeeding.  Physical Exam:  General: alert, cooperative and no distress Lochia: appropriate Uterine Fundus: firm DVT Evaluation: No evidence of DVT seen on physical exam. No cords or calf tenderness. No significant calf/ankle edema.  Recent Labs    08/27/17 0232  HGB 11.0*  HCT 33.1*    Assessment/Plan: Plan for discharge tomorrow and Contraception deciding between IUD vs. Nexplanon. Plan for both breast and bottle feeding   LOS: 1 day   Oralia ManisSherin Abraham 08/28/2017, 6:01 AM   I confirm that I have verified the information documented in the resident's note and that I have also personally reperformed the physical exam and all medical decision making activities.   Thressa ShellerHeather Hogan 12:31 PM 08/28/17

## 2017-08-29 MED ORDER — IBUPROFEN 600 MG PO TABS: 600.0000 mg | ORAL_TABLET | Freq: Four times a day (QID) | ORAL | 0 refills | Status: DC

## 2017-08-29 MED ORDER — DOCUSATE SODIUM 100 MG PO CAPS: 100.0000 mg | ORAL_CAPSULE | Freq: Two times a day (BID) | ORAL | 0 refills | Status: DC

## 2017-08-29 MED ORDER — FERROUS SULFATE 325 (65 FE) MG PO TABS: 325.0000 mg | ORAL_TABLET | Freq: Two times a day (BID) | ORAL | 3 refills | Status: DC

## 2017-08-29 NOTE — Lactation Note (Signed)
This note was copied from a baby's chart. Lactation Consultation Note  Patient Name: Andrea Axel FillerLaura Caldwell ZOXWR'UToday's Date: 08/29/2017 Reason for consult: Follow-up assessment;Nipple pain/trauma;Early term 4837-38.6wks  Visited with Mom of 7528w2d baby at 5850 hrs old.  Baby at 7% weight loss, output good. Mom has been offering some bottles of formula. Complaining of some pain on latch, one small blister noted on tip of nipple on right breast. Reviewed hand expression, transitional milk easily expressed.  Offered to assist and assess latch. Baby latched easily with assistance.  Mom hunching over to latch before baby opens wide enough. Baby easily latched once Mom instructed on importance of good pillow support, and breast support.  Baby became nutritive after a few minutes, multiple swallows identified for Mom.  Encouraged alternate breast compression to increase milk transfer. Mom declines pump.    Encouraged keeping baby STS, and feeding often on cue.  Breasts are filling, and explained importance of a deep latch, and frequent feedings >8 times per 24 hrs.  Engorgement prevention and treatment discussed.  Mom aware of OP lactation services available to her.  Encouraged her to call prn.  Feeding Feeding Type: Breast Fed Length of feed: 15 min  LATCH Score Latch: Grasps breast easily, tongue down, lips flanged, rhythmical sucking.  Audible Swallowing: Spontaneous and intermittent  Type of Nipple: Everted at rest and after stimulation  Comfort (Breast/Nipple): Filling, red/small blisters or bruises, mild/mod discomfort  Hold (Positioning): Assistance needed to correctly position infant at breast and maintain latch.  LATCH Score: 8  Interventions Interventions: Breast feeding basics reviewed;Assisted with latch;Skin to skin;Breast massage;Hand express;Breast compression;Adjust position;Support pillows;Position options;Expressed milk;Coconut oil  Consult Status Consult Status:  Complete Date: 08/29/17 Follow-up type: Call as needed    [name], [name] E 08/29/2017, 10:50 AM

## 2017-08-29 NOTE — Discharge Summary (Signed)
       OB Discharge Summary  Patient Name: Andrea FillerLaura Caldwell DOB: 05-25-1991 MRN: [id number]  Date of admission: 08/27/2017 Delivering MD: Jacklyn ShellRESENZO-DISHMON, FRANCES   Date of discharge: 08/29/2017  Admitting diagnosis: 37 WEEKS CTX BLEEDING Intrauterine pregnancy: 594w2d     Secondary diagnosis:Active Problems:   Normal labor  Additional problems:none     Discharge diagnosis: Term Pregnancy Delivered                                                                     Post partum procedures:none  Augmentation: n/a  Complications: None  Hospital course:  Onset of Labor With Vaginal Delivery     27 y.o. yo [id number] at 664w2d was admitted in Active Labor on 08/27/2017. Patient had an uncomplicated labor course as follows:  Membrane Rupture Time/Date: 7:30 AM ,08/27/2017   Intrapartum Procedures: Episiotomy: None [1]                                         Lacerations:  None [1]  Patient had a delivery of a Viable infant. 08/27/2017  Information for the patient's newborn:  Andrea Caldwell, Andrea Caldwell [[id number]][[id number]]  Delivery Method: Vag-Spont    Pateint had an uncomplicated postpartum course.  She is ambulating, tolerating a regular diet, passing flatus, and urinating well. Patient is discharged home in stable condition on 08/29/17.   Physical exam  Vitals:   08/27/17 2053 08/28/17 0500 08/28/17 1845 08/29/17 0516  BP: 112/61 114/67 126/61 125/75  Pulse: 76 62 66 82  Resp: 18 18 13 18   Temp: 98.1 F (36.7 C) 98.2 F (36.8 C) 98.5 F (36.9 C) 98.2 F (36.8 C)  TempSrc: Oral Oral Oral Oral  SpO2: 99%     Weight:  146 lb (66.2 kg)  146 lb (66.2 kg)  Height:       General: alert, cooperative and no distress Lochia: appropriate Uterine Fundus: firm Incision: N/A DVT Evaluation: No evidence of DVT seen on physical exam. Labs: Lab Results  Component Value Date   WBC 9.6 08/27/2017   HGB 11.0 (L) 08/27/2017   HCT 33.1 (L) 08/27/2017   MCV 80.1 08/27/2017   PLT 242  08/27/2017   No flowsheet data found.  Discharge instruction: per After Visit Summary and "Baby and Me Booklet".  After Visit Meds:    Diet: routine diet  Activity: Advance as tolerated. Pelvic rest for 6 weeks.   Outpatient follow up:6 weeks Follow up Appt: Future Appointments  Date Time Provider Department Center  09/23/2017  1:00 PM Anyanwu, Jethro BastosUgonna A, MD [facility] None   Follow up visit: No Follow-up on file.  Postpartum contraception: Undecided  Newborn Data: Live born female  Birth Weight: 6 lb 11.2 oz (3040 g) APGAR: 8, 9  Newborn Delivery   Birth date/time:  08/27/2017 08:06:00 Delivery type:  Vaginal, Spontaneous     Baby Feeding: Breast Disposition:home with mother   08/29/2017 Wyvonnia DuskyMarie Harlyn Italiano, CNM

## 2017-09-23 ENCOUNTER — Ambulatory Visit (INDEPENDENT_AMBULATORY_CARE_PROVIDER_SITE_OTHER): Payer: Self-pay | Admitting: Obstetrics & Gynecology

## 2017-09-23 ENCOUNTER — Encounter: Payer: Self-pay | Admitting: Obstetrics & Gynecology

## 2017-09-23 NOTE — Progress Notes (Signed)
Post Partum Exam  Andrea FillerLaura Caldwell is a 27 y.o. 602P1102 female who presents for a postpartum visit. She is 4 weeks postpartum following a spontaneous vaginal delivery. I have fully reviewed the prenatal and intrapartum course. The delivery was at 37 gestational weeks.  Anesthesia: epidural. Postpartum course has been good. Baby's course has been good. Baby is feeding by both breast and bottle - Similac Advance. Bleeding thin lochia. Bowel function is normal. Bladder function is normal. Patient is not sexually active. Contraception method is none. Postpartum depression screening:neg  The following portions of the patient's history were reviewed and updated as appropriate: allergies, current medications, past family history, past medical history, past social history, past surgical history and problem list. Last pap smear done 04/02/2017 and was Normal  Review of Systems Pertinent items noted in HPI and remainder of comprehensive ROS otherwise negative.    Objective:  Blood pressure 116/73, pulse 80, weight 137 lb (62.1 kg), last menstrual period 12/09/2016, currently breastfeeding.  General:  alert and no distress   Breasts:  inspection negative, no nipple discharge or bleeding, no masses or nodularity palpable  Lungs: clear to auscultation bilaterally  Heart:  regular rate and rhythm  Abdomen: soft, non-tender; bowel sounds normal; no masses,  no organomegaly   Pelvic:  not evaluated        Assessment:   Normal postpartum exam. Pap smear not done at today's visit.   Plan:   1. Cont[facility]: Nexplanon, [facility] 2. Follow up as needed.     Jaynie CollinsUGONNA  Glynda Soliday, MD, FACOG Obstetrician & Gynecologist, [facility], [facility]

## 2017-12-07 IMAGING — US US OB COMP LESS 14 WK
1 series · 15 of 28 positions shown · non-contrast
Comparison: None.

CLINICAL DATA: Pelvic pain affecting pregnancy. Gestational age by
LMP of 9 weeks 2 days.

EXAM:
OBSTETRIC <14 WK ULTRASOUND
TECHNIQUE: Transabdominal ultrasound was performed for evaluation of the
gestation as well as the maternal uterus and adnexal regions.

[Series 1: us ob comp less 14 wk · 15 of 54 slices shown]
[im 1/54]
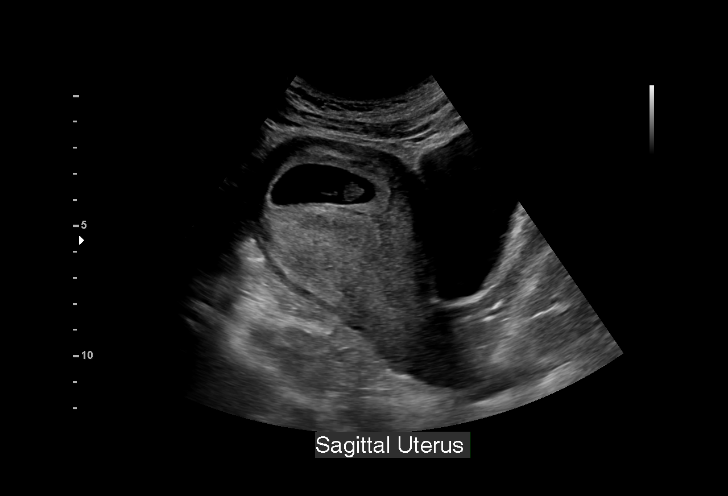
[im 4/54]
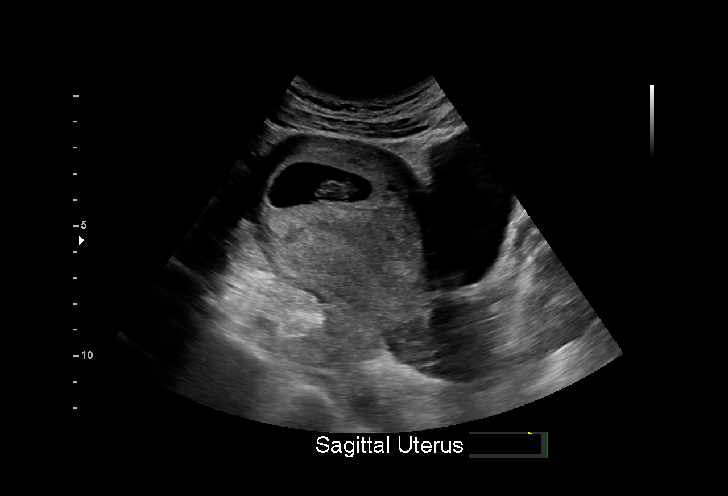
[im 8/54]
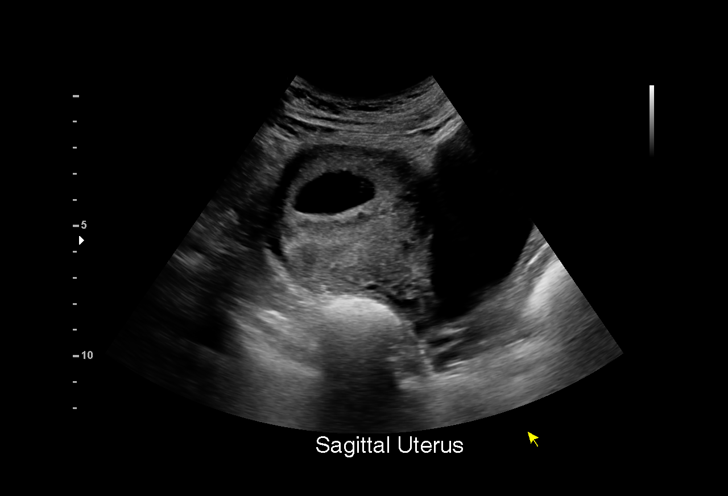
[im 12/54]
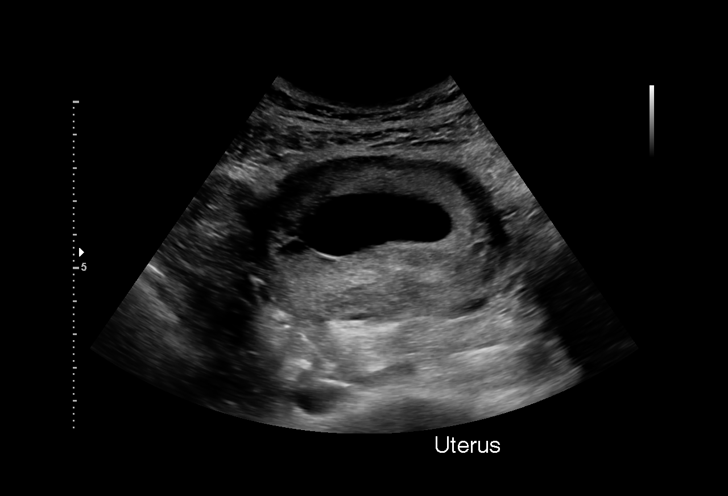
[im 16/54]
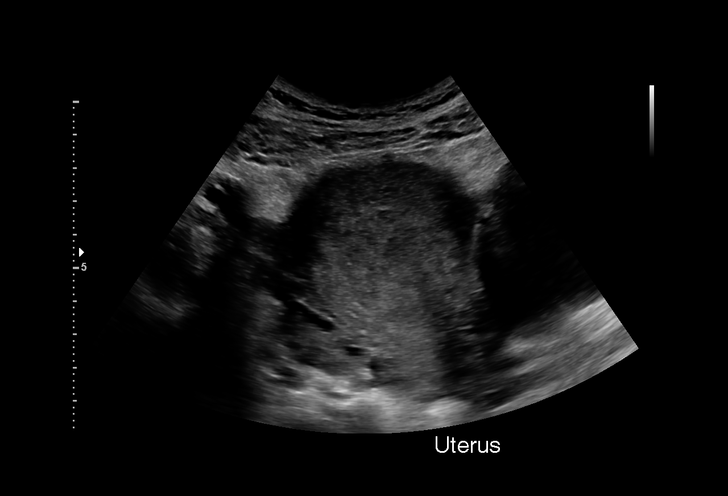
[im 20/54]
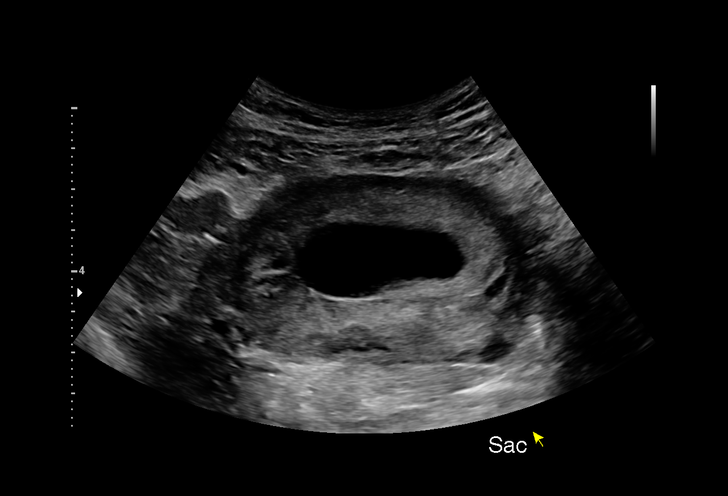
[im 24/54]
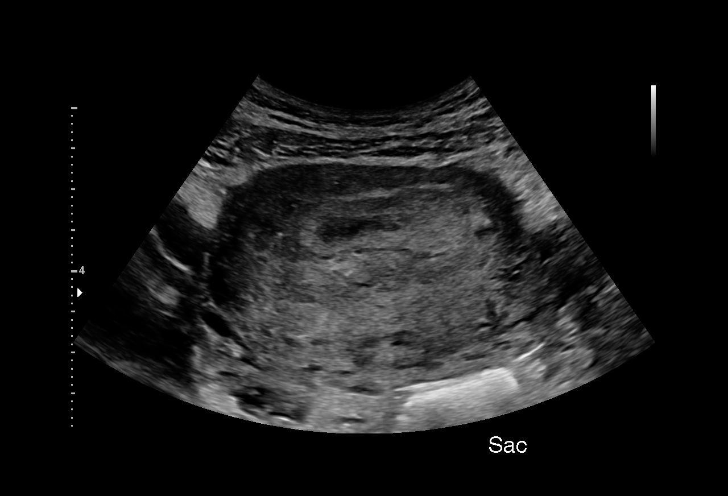
[im 28/54]
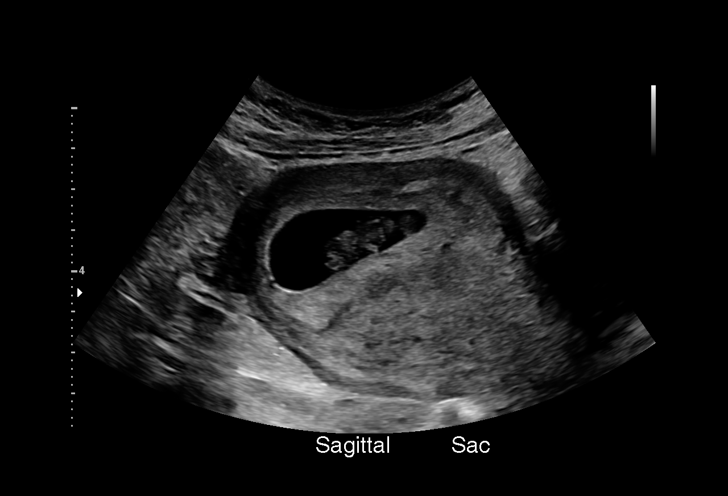
[im 30/54]
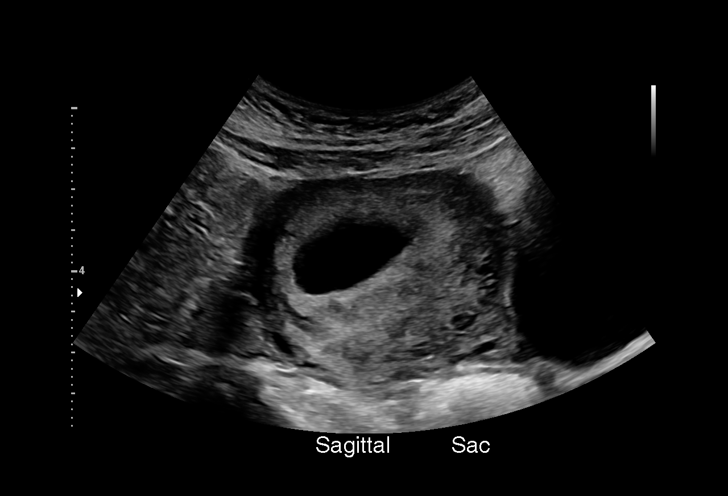
[im 34/54]
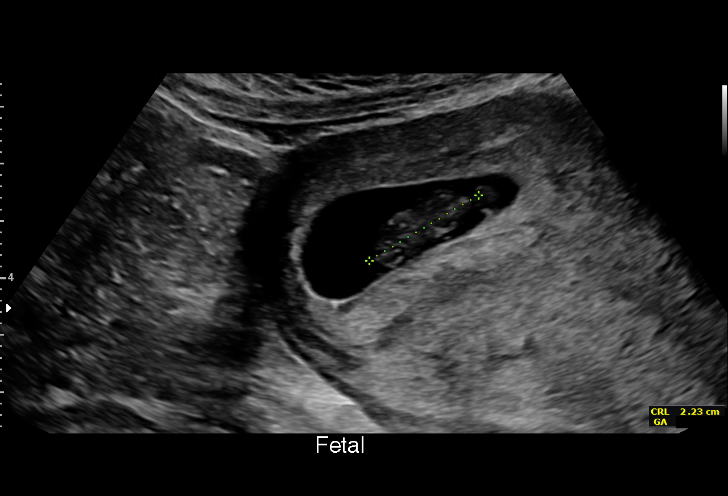
[im 38/54]
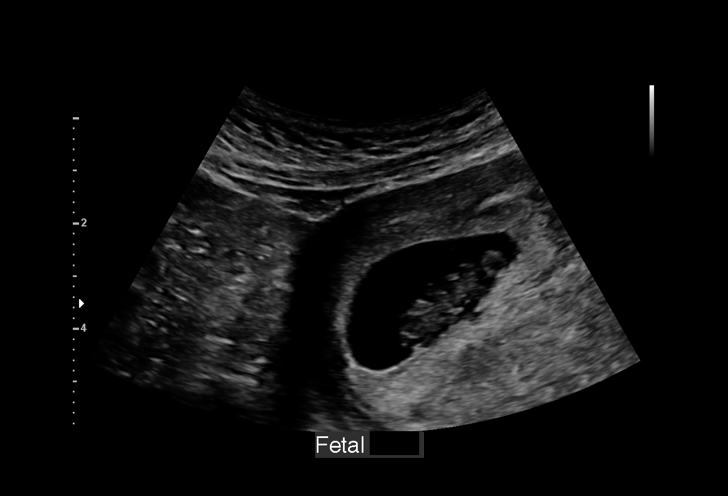
[im 42/54]
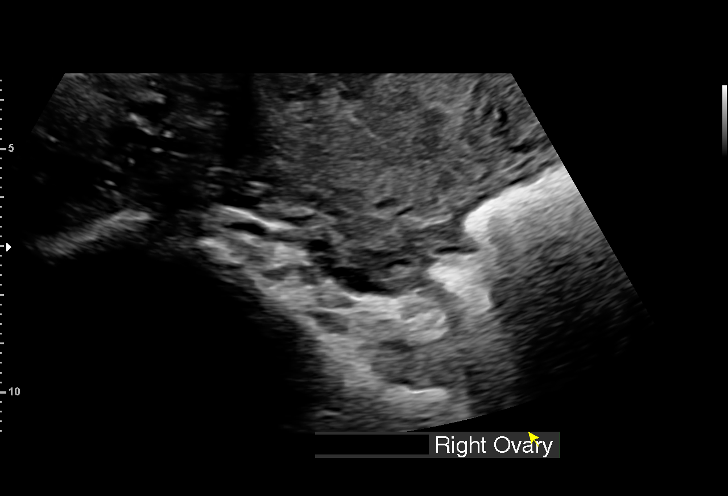
[im 46/54]
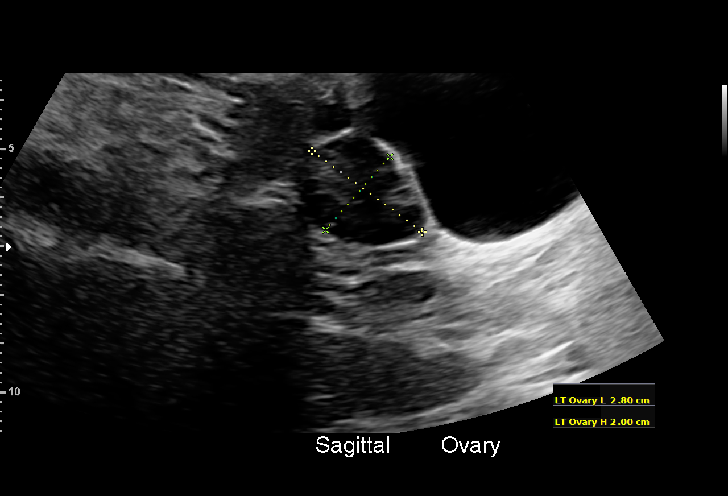
[im 50/54]
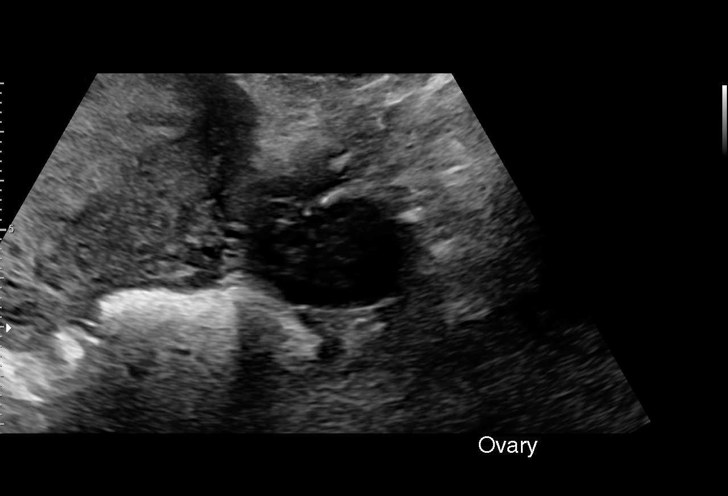
[im 54/54]
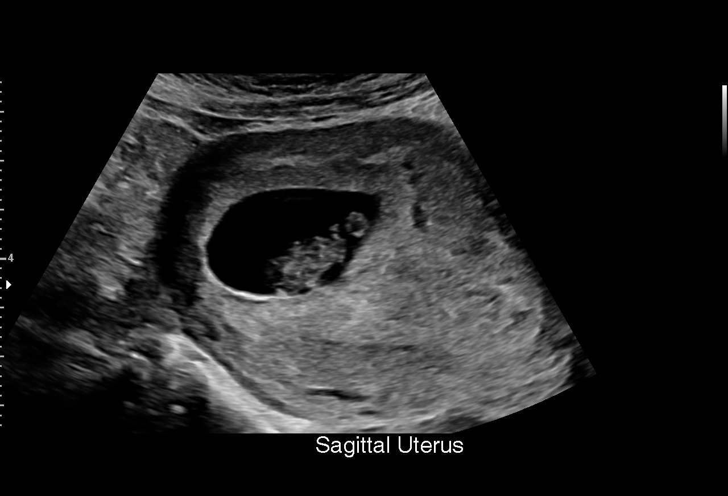

[15 of 28 positions shown; findings below may reference images not displayed]

FINDINGS: Intrauterine gestational sac: Single

Yolk sac:  Visualized.

Embryo:  Visualized.

Cardiac Activity: Visualized.

Heart Rate: 171 bpm

CRL:   23  mm   8 w 6 d                  US EDC: 09/18/2017

Subchorionic hemorrhage:  None visualized.

Maternal uterus/adnexae: Both ovaries are normal appearance. No mass
or abnormal free fluid identified.
IMPRESSION: Single living IUP measuring 8 weeks 6 days, with US EDC of
09/18/2017.

No significant maternal uterine or adnexal abnormality identified.

## 2019-01-02 ENCOUNTER — Ambulatory Visit
Admission: EM | Admit: 2019-01-02 | Discharge: 2019-01-02 | Disposition: A | Discharge status: Home / Self Care | Payer: Self-pay

## 2019-01-02 DIAGNOSIS — R432 Parageusia: Secondary | ICD-10-CM

## 2019-01-02 NOTE — ED Triage Notes (Signed)
Pt states loss of taste and smell since last Monday, denies any other sx's

## 2019-01-02 NOTE — Discharge Instructions (Addendum)
Person Under Monitoring Name: Andrea Caldwell  Location: [address] [place] [address]   Infection Prevention Recommendations for Individuals Confirmed to have, or Being Evaluated for, 2019 Novel Coronavirus (COVID-19) Infection Who Receive Care at Home  Individuals who are confirmed to have, or are being evaluated for, COVID-19 should follow the prevention steps below until a healthcare provider or local or state health department says they can return to normal activities.  Stay home except to get medical care You should restrict activities outside your home, except for getting medical care. Do not go to work, school, or public areas, and do not use public transportation or taxis.  Call ahead before visiting your doctor Before your medical appointment, call the healthcare provider and tell them that you have, or are being evaluated for, COVID-19 infection. This will help the healthcare providers office take steps to keep other people from getting infected. Ask your healthcare provider to call the local or state health department.  Monitor your symptoms Seek prompt medical attention if your illness is worsening (e.g., difficulty breathing). Before going to your medical appointment, call the healthcare provider and tell them that you have, or are being evaluated for, COVID-19 infection. Ask your healthcare provider to call the local or state health department.  Wear a facemask You should wear a facemask that covers your nose and mouth when you are in the same room with other people and when you visit a healthcare provider. People who live with or visit you should also wear a facemask while they are in the same room with you.  Separate yourself from other people in your home As much as possible, you should stay in a different room from other people in your home. Also, you should use a separate bathroom, if available.  Avoid sharing household items You should not  share dishes, drinking glasses, cups, eating utensils, towels, bedding, or other items with other people in your home. After using these items, you should wash them thoroughly with soap and water.  Cover your coughs and sneezes Cover your mouth and nose with a tissue when you cough or sneeze, or you can cough or sneeze into your sleeve. Throw used tissues in a lined trash can, and immediately wash your hands with soap and water for at least 20 seconds or use an alcohol-based hand rub.  Wash your [employer] your hands often and thoroughly with soap and water for at least 20 seconds. You can use an alcohol-based hand sanitizer if soap and water are not available and if your hands are not visibly dirty. Avoid touching your eyes, nose, and mouth with unwashed hands.   Prevention Steps for Caregivers and Household Members of Individuals Confirmed to have, or Being Evaluated for, COVID-19 Infection Being Cared for in the Home  If you live with, or provide care at home for, a person confirmed to have, or being evaluated for, COVID-19 infection please follow these guidelines to prevent infection:  Follow healthcare providers instructions Make sure that you understand and can help the patient follow any healthcare provider instructions for all care.  Provide for the patients basic needs You should help the patient with basic needs in the home and provide support for getting groceries, prescriptions, and other personal needs.  Monitor the patients symptoms If they are getting sicker, call his or her medical provider and tell them that the patient has, or is being evaluated for, COVID-19 infection. This will help the healthcare providers office take  steps to keep other people from getting infected. Ask the healthcare provider to call the local or state health department.  Limit the number of people who have contact with the patient If possible, have only one caregiver for the  patient. Other household members should stay in another home or place of residence. If this is not possible, they should stay in another room, or be separated from the patient as much as possible. Use a separate bathroom, if available. Restrict visitors who do not have an essential need to be in the home.  Keep older adults, very young children, and other sick people away from the patient Keep older adults, very young children, and those who have compromised immune systems or chronic health conditions away from the patient. This includes people with chronic heart, lung, or kidney conditions, diabetes, and cancer.  Ensure good ventilation Make sure that shared spaces in the home have good air flow, such as from an air conditioner or an opened window, weather permitting.  Wash your hands often Wash your hands often and thoroughly with soap and water for at least 20 seconds. You can use an alcohol based hand sanitizer if soap and water are not available and if your hands are not visibly dirty. Avoid touching your eyes, nose, and mouth with unwashed hands. Use disposable paper towels to dry your hands. If not available, use dedicated cloth towels and replace them when they become wet.  Wear a facemask and gloves Wear a disposable facemask at all times in the room and gloves when you touch or have contact with the patients blood, body fluids, and/or secretions or excretions, such as sweat, saliva, sputum, nasal mucus, vomit, urine, or feces.  Ensure the mask fits over your nose and mouth tightly, and do not touch it during use. Throw out disposable facemasks and gloves after using them. Do not reuse. Wash your hands immediately after removing your facemask and gloves. If your personal clothing becomes contaminated, carefully remove clothing and launder. Wash your hands after handling contaminated clothing. Place all used disposable facemasks, gloves, and other waste in a lined container before  disposing them with other household waste. Remove gloves and wash your hands immediately after handling these items.  Do not share dishes, glasses, or other household items with the patient Avoid sharing household items. You should not share dishes, drinking glasses, cups, eating utensils, towels, bedding, or other items with a patient who is confirmed to have, or being evaluated for, COVID-19 infection. After the person uses these items, you should wash them thoroughly with soap and water.  Wash laundry thoroughly Immediately remove and wash clothes or bedding that have blood, body fluids, and/or secretions or excretions, such as sweat, saliva, sputum, nasal mucus, vomit, urine, or feces, on them. Wear gloves when handling laundry from the patient. Read and follow directions on labels of laundry or clothing items and detergent. In general, wash and dry with the warmest temperatures recommended on the label.  Clean all areas the individual has used often Clean all touchable surfaces, such as counters, tabletops, doorknobs, bathroom fixtures, toilets, phones, keyboards, tablets, and bedside tables, every day. Also, clean any surfaces that may have blood, body fluids, and/or secretions or excretions on them. Wear gloves when cleaning surfaces the patient has come in contact with. Use a diluted bleach solution (e.g., dilute bleach with 1 part bleach and 10 parts water) or a household disinfectant with a label that says EPA-registered for coronaviruses. To make a bleach solution  at home, add 1 tablespoon of bleach to 1 quart (4 cups) of water. For a larger supply, add  cup of bleach to 1 gallon (16 cups) of water. Read labels of cleaning products and follow recommendations provided on product labels. Labels contain instructions for safe and effective use of the cleaning product including precautions you should take when applying the product, such as wearing gloves or eye protection and making sure you  have good ventilation during use of the product. Remove gloves and wash hands immediately after cleaning.  Monitor yourself for signs and symptoms of illness Caregivers and household members are considered close contacts, should monitor their health, and will be asked to limit movement outside of the home to the extent possible. Follow the monitoring steps for close contacts listed on the symptom monitoring form.   ? If you have additional questions, contact your local health department or call the epidemiologist on call at [phone] (available 24/7). ? This guidance is subject to change. For the most up-to-date guidance from [facility], please refer to their website: YouBlogs.pl

## 2019-01-04 NOTE — ED Provider Notes (Signed)
[facility]    CSN: [id number] Arrival date & time: 01/02/19  1305     History   Chief Complaint Chief Complaint  Patient presents with  . loss of taste and smell    HPI Andrea Caldwell is a 28 y.o. female.   Patient reports she lost her sense of taste and smell last Monday.  She denies any other symptoms she denies any cough no fever no respiratory illness she has not had any shortness of breath she denies any nausea or vomiting.  She is concerned that she could have COVID she is also concerned that she could have other problems that have caused her to lose her sense of smell and taste.  The history is provided by the patient. No language interpreter was used.    Past Medical History:  Diagnosis Date  . Headache   . Preterm labor    delivered 1 month early with first baby    Patient Active Problem List   Diagnosis Date Noted  . Non-English speaking patient 04/30/2017    Past Surgical History:  Procedure Laterality Date  . NO PAST SURGERIES      OB History    Gravida  2   Para  2   Term  1   Preterm  1   AB      Living  2     SAB      TAB      Ectopic      Multiple  0   Live Births  2            Home Medications    Prior to Admission medications   Medication Sig Start Date End Date Taking? Authorizing Provider  docusate sodium (COLACE) 100 MG capsule Take 1 capsule (100 mg total) by mouth 2 (two) times daily. 08/29/17   Montez Morita, CNM  ferrous sulfate 325 (65 FE) MG tablet Take 1 tablet (325 mg total) by mouth 2 (two) times daily with a meal. 08/29/17   Montez Morita, CNM  ibuprofen (ADVIL,MOTRIN) 600 MG tablet Take 1 tablet (600 mg total) by mouth every 6 (six) hours. 08/29/17   Montez Morita, CNM    Family History Family History  Problem Relation Age of Onset  . Cancer Mother   . Hyperlipidemia Father   . Heart disease Father   . Diabetes Maternal Grandmother   . Kidney disease Maternal Aunt   .  Diabetes Paternal Uncle     Social History Social History   Tobacco Use  . Smoking status: Never Smoker  . Smokeless tobacco: Never Used  Substance Use Topics  . Alcohol use: No  . Drug use: No     Allergies   Patient has no known allergies.   Review of Systems Review of Systems  HENT: Negative for congestion, dental problem, ear pain, mouth sores, nosebleeds, rhinorrhea, sinus pressure and sinus pain.   Respiratory: Negative for cough and wheezing.   All other systems reviewed and are negative.    Physical Exam Triage Vital Signs ED Triage Vitals  Enc Vitals Group     BP 01/02/19 1322 (!) 145/79     Pulse Rate 01/02/19 1322 71     Resp 01/02/19 1322 18     Temp 01/02/19 1322 98.4 F (36.9 C)     Temp Source 01/02/19 1322 Oral     SpO2 01/02/19 1322 97 %     Weight --      Height --  Head Circumference --      Peak Flow --      Pain Score 01/02/19 1323 0     Pain Loc --      Pain Edu? --      Excl. in GC? --    No data found.  Updated Vital Signs BP (!) 145/79 (BP Location: Left Arm)   Pulse 71   Temp 98.4 F (36.9 C) (Oral)   Resp 18   LMP 01/02/2019   SpO2 97%   Visual Acuity Right Eye Distance:   Left Eye Distance:   Bilateral Distance:    Right Eye Near:   Left Eye Near:    Bilateral Near:     Physical Exam Vitals signs and nursing note reviewed.  Constitutional:      Appearance: She is well-developed.  HENT:     Head: Normocephalic.     Right Ear: Tympanic membrane normal.     Left Ear: Tympanic membrane normal.     Nose: Nose normal.     Mouth/Throat:     Mouth: Mucous membranes are moist.  Eyes:     Pupils: Pupils are equal, round, and reactive to light.  Neck:     Musculoskeletal: Normal range of motion.  Cardiovascular:     Rate and Rhythm: Normal rate.     Pulses: Normal pulses.  Pulmonary:     Effort: Pulmonary effort is normal.  Abdominal:     General: Abdomen is flat. There is no distension.  Musculoskeletal:  Normal range of motion.  Skin:    General: Skin is warm.  Neurological:     General: No focal deficit present.     Mental Status: She is alert and oriented to person, place, and time.  Psychiatric:        Mood and Affect: Mood normal.      UC Treatments / Results  Labs (all labs ordered are listed, but only abnormal results are displayed) Labs Reviewed - No data to display  EKG None  Radiology No results found.  Procedures Procedures (including critical care time)  Medications Ordered in UC Medications - No data to display  Initial Impression / Assessment and Plan / UC Course  I have reviewed the triage vital signs and the nursing notes.  Pertinent labs & imaging results that were available during my care of the patient were reviewed by me and considered in my medical decision making (see chart for details).     MDM patient is overall healthy he has no significant past medical history she has not had any COVID exposures that she is aware of.  Patient has not had a fever she has not had a cough.  Does not have any other neurologic findings.  Advised patient that this urgent care is not doing COVID testing.  Patient advised to self quarantine.  He is advised of signs and symptoms that would merit other evaluation/going to the emergency department. Final Clinical Impressions(s) / UC Diagnoses   Final diagnoses:  Loss of taste     Discharge Instructions        Person Under Monitoring Name: Andrea Caldwell  Location: [address] [place] [address]   Infection Prevention Recommendations for Individuals Confirmed to have, or Being Evaluated for, 2019 Novel Coronavirus (COVID-19) Infection Who Receive Care at Home  Individuals who are confirmed to have, or are being evaluated for, COVID-19 should follow the prevention steps below until a healthcare provider or local or state health department says  they can return to normal activities.  Stay home  except to get medical care You should restrict activities outside your home, except for getting medical care. Do not go to work, school, or public areas, and do not use public transportation or taxis.  Call ahead before visiting your doctor Before your medical appointment, call the healthcare provider and tell them that you have, or are being evaluated for, COVID-19 infection. This will help the healthcare provider's office take steps to keep other people from getting infected. Ask your healthcare provider to call the local or state health department.  Monitor your symptoms Seek prompt medical attention if your illness is worsening (e.g., difficulty breathing). Before going to your medical appointment, call the healthcare provider and tell them that you have, or are being evaluated for, COVID-19 infection. Ask your healthcare provider to call the local or state health department.  Wear a facemask You should wear a facemask that covers your nose and mouth when you are in the same room with other people and when you visit a healthcare provider. People who live with or visit you should also wear a facemask while they are in the same room with you.  Separate yourself from other people in your home As much as possible, you should stay in a different room from other people in your home. Also, you should use a separate bathroom, if available.  Avoid sharing household items You should not share dishes, drinking glasses, cups, eating utensils, towels, bedding, or other items with other people in your home. After using these items, you should wash them thoroughly with soap and water.  Cover your coughs and sneezes Cover your mouth and nose with a tissue when you cough or sneeze, or you can cough or sneeze into your sleeve. Throw used tissues in a lined trash can, and immediately wash your hands with soap and water for at least 20 seconds or use an alcohol-based hand rub.  Wash your [employer]  your hands often and thoroughly with soap and water for at least 20 seconds. You can use an alcohol-based hand sanitizer if soap and water are not available and if your hands are not visibly dirty. Avoid touching your eyes, nose, and mouth with unwashed hands.   Prevention Steps for Caregivers and Household Members of Individuals Confirmed to have, or Being Evaluated for, COVID-19 Infection Being Cared for in the Home  If you live with, or provide care at home for, a person confirmed to have, or being evaluated for, COVID-19 infection please follow these guidelines to prevent infection:  Follow healthcare provider's instructions Make sure that you understand and can help the patient follow any healthcare provider instructions for all care.  Provide for the patient's basic needs You should help the patient with basic needs in the home and provide support for getting groceries, prescriptions, and other personal needs.  Monitor the patient's symptoms If they are getting sicker, call his or her medical provider and tell them that the patient has, or is being evaluated for, COVID-19 infection. This will help the healthcare provider's office take steps to keep other people from getting infected. Ask the healthcare provider to call the local or state health department.  Limit the number of people who have contact with the patient  If possible, have only one caregiver for the patient.  Other household members should stay in another home or place of residence. If this is not possible, they should stay  in another room, or be  separated from the patient as much as possible. Use a separate bathroom, if available.  Restrict visitors who do not have an essential need to be in the home.  Keep older adults, very young children, and other sick people away from the patient Keep older adults, very young children, and those who have compromised immune systems or chronic health conditions away from the  patient. This includes people with chronic heart, lung, or kidney conditions, diabetes, and cancer.  Ensure good ventilation Make sure that shared spaces in the home have good air flow, such as from an air conditioner or an opened window, weather permitting.  Wash your hands often  Wash your hands often and thoroughly with soap and water for at least 20 seconds. You can use an alcohol based hand sanitizer if soap and water are not available and if your hands are not visibly dirty.  Avoid touching your eyes, nose, and mouth with unwashed hands.  Use disposable paper towels to dry your hands. If not available, use dedicated cloth towels and replace them when they become wet.  Wear a facemask and gloves  Wear a disposable facemask at all times in the room and gloves when you touch or have contact with the patient's blood, body fluids, and/or secretions or excretions, such as sweat, saliva, sputum, nasal mucus, vomit, urine, or feces.  Ensure the mask fits over your nose and mouth tightly, and do not touch it during use.  Throw out disposable facemasks and gloves after using them. Do not reuse.  Wash your hands immediately after removing your facemask and gloves.  If your personal clothing becomes contaminated, carefully remove clothing and launder. Wash your hands after handling contaminated clothing.  Place all used disposable facemasks, gloves, and other waste in a lined container before disposing them with other household waste.  Remove gloves and wash your hands immediately after handling these items.  Do not share dishes, glasses, or other household items with the patient  Avoid sharing household items. You should not share dishes, drinking glasses, cups, eating utensils, towels, bedding, or other items with a patient who is confirmed to have, or being evaluated for, COVID-19 infection.  After the person uses these items, you should wash them thoroughly with soap and water.  Wash  laundry thoroughly  Immediately remove and wash clothes or bedding that have blood, body fluids, and/or secretions or excretions, such as sweat, saliva, sputum, nasal mucus, vomit, urine, or feces, on them.  Wear gloves when handling laundry from the patient.  Read and follow directions on labels of laundry or clothing items and detergent. In general, wash and dry with the warmest temperatures recommended on the label.  Clean all areas the individual has used often  Clean all touchable surfaces, such as counters, tabletops, doorknobs, bathroom fixtures, toilets, phones, keyboards, tablets, and bedside tables, every day. Also, clean any surfaces that may have blood, body fluids, and/or secretions or excretions on them.  Wear gloves when cleaning surfaces the patient has come in contact with.  Use a diluted bleach solution (e.g., dilute bleach with 1 part bleach and 10 parts water) or a household disinfectant with a label that says EPA-registered for coronaviruses. To make a bleach solution at home, add 1 tablespoon of bleach to 1 quart (4 cups) of water. For a larger supply, add  cup of bleach to 1 gallon (16 cups) of water.  Read labels of cleaning products and follow recommendations provided on product labels. Labels contain instructions for safe  and effective use of the cleaning product including precautions you should take when applying the product, such as wearing gloves or eye protection and making sure you have good ventilation during use of the product.  Remove gloves and wash hands immediately after cleaning.  Monitor yourself for signs and symptoms of illness Caregivers and household members are considered close contacts, should monitor their health, and will be asked to limit movement outside of the home to the extent possible. Follow the monitoring steps for close contacts listed on the symptom monitoring form.   ? If you have additional questions, contact your local health  department or call the epidemiologist on call at [phone] (available 24/7). ? This guidance is subject to change. For the most up-to-date guidance from [facility], please refer to their website: [email or web address][email or web address]    ED Prescriptions    None     Controlled Substance Prescriptions [place] Controlled Substance Registry consulted? Not Applicable   [place], [place] 01/04/19 1446

## 2021-08-09 ENCOUNTER — Encounter (HOSPITAL_COMMUNITY): Payer: Self-pay

## 2021-08-09 ENCOUNTER — Ambulatory Visit (HOSPITAL_COMMUNITY)
Admission: EM | Admit: 2021-08-09 | Discharge: 2021-08-09 | Disposition: A | Discharge status: Home / Self Care | Payer: Self-pay | Attending: Physician Assistant | Admitting: Physician Assistant

## 2021-08-09 ENCOUNTER — Other Ambulatory Visit: Payer: Self-pay

## 2021-08-09 DIAGNOSIS — J111 Influenza due to unidentified influenza virus with other respiratory manifestations: Secondary | ICD-10-CM

## 2021-08-09 LAB — POC INFLUENZA A AND B ANTIGEN (URGENT CARE ONLY)
INFLUENZA A ANTIGEN, POC: POSITIVE — AB
INFLUENZA B ANTIGEN, POC: NEGATIVE

## 2021-08-09 MED ORDER — OSELTAMIVIR PHOSPHATE 75 MG PO CAPS: 75.0000 mg | ORAL_CAPSULE | Freq: Two times a day (BID) | ORAL | 0 refills | Status: DC

## 2021-08-09 NOTE — ED Provider Notes (Signed)
[facility]    CSN: [id number] Arrival date & time: 08/09/21  1137      History   Chief Complaint Chief Complaint  Patient presents with   Generalized Body Aches    HPI Andrea Caldwell is a 30 y.o. female.   Patient here today for evaluation of body aches, cough, nasal congestion and drainage, and headaches that started several days ago.  She reports that she has tried over-the-counter medication with mild relief.  Daughter is here today with similar symptoms.  The history is provided by the patient.   Past Medical History:  Diagnosis Date   Headache    Preterm labor    delivered 1 month early with first baby    Patient Active Problem List   Diagnosis Date Noted   Non-English speaking patient 04/30/2017    Past Surgical History:  Procedure Laterality Date   NO PAST SURGERIES      OB History     Gravida  2   Para  2   Term  1   Preterm  1   AB      Living  2      SAB      IAB      Ectopic      Multiple  0   Live Births  2            Home Medications    Prior to Admission medications   Medication Sig Start Date End Date Taking? Authorizing Provider  oseltamivir (TAMIFLU) 75 MG capsule Take 1 capsule (75 mg total) by mouth every 12 (twelve) hours. 08/09/21  Yes Tomi Bamberger, PA-C  docusate sodium (COLACE) 100 MG capsule Take 1 capsule (100 mg total) by mouth 2 (two) times daily. 08/29/17   Montez Morita, CNM  ferrous sulfate 325 (65 FE) MG tablet Take 1 tablet (325 mg total) by mouth 2 (two) times daily with a meal. 08/29/17   Montez Morita, CNM  ibuprofen (ADVIL,MOTRIN) 600 MG tablet Take 1 tablet (600 mg total) by mouth every 6 (six) hours. 08/29/17   Montez Morita, CNM    Family History Family History  Problem Relation Age of Onset   Cancer Mother    Hyperlipidemia Father    Heart disease Father    Diabetes Maternal Grandmother    Kidney disease Maternal Aunt    Diabetes Paternal Uncle     Social  History Social History   Tobacco Use   Smoking status: Never   Smokeless tobacco: Never  Vaping Use   Vaping Use: Never used  Substance Use Topics   Alcohol use: No   Drug use: No     Allergies   Patient has no known allergies.   Review of Systems Review of Systems  Constitutional:  Negative for chills and fever.  HENT:  Positive for congestion. Negative for ear pain and sore throat.   Eyes:  Negative for discharge and redness.  Respiratory:  Positive for cough. Negative for shortness of breath and wheezing.   Gastrointestinal:  Negative for abdominal pain, diarrhea, nausea and vomiting.  Musculoskeletal:  Positive for myalgias.  Neurological:  Positive for headaches.    Physical Exam Triage Vital Signs ED Triage Vitals [08/09/21 1250]  Enc Vitals Group     BP 132/79     Pulse Rate (!) 107     Resp 18     Temp 99.1 F (37.3 C)     Temp Source Oral  SpO2 98 %     Weight      Height      Head Circumference      Peak Flow      Pain Score 0     Pain Loc      Pain Edu?      Excl. in GC?    No data found.  Updated Vital Signs BP 132/79 (BP Location: Left Arm)    Pulse (!) 107    Temp 99.1 F (37.3 C) (Oral)    Resp 18    SpO2 98%    Breastfeeding No   Physical Exam Vitals and nursing note reviewed.  Constitutional:      General: She is not in acute distress.    Appearance: Normal appearance. She is not ill-appearing.  HENT:     Head: Normocephalic and atraumatic.  Eyes:     Conjunctiva/sclera: Conjunctivae normal.  Cardiovascular:     Rate and Rhythm: Normal rate and regular rhythm.     Heart sounds: Normal heart sounds. No murmur heard. Pulmonary:     Effort: Pulmonary effort is normal. No respiratory distress.     Breath sounds: Normal breath sounds. No wheezing, rhonchi or rales.  Skin:    General: Skin is warm and dry.  Neurological:     Mental Status: She is alert.  Psychiatric:        Mood and Affect: Mood normal.        Thought Content:  Thought content normal.     UC Treatments / Results  Labs (all labs ordered are listed, but only abnormal results are displayed) Labs Reviewed  POC INFLUENZA A AND B ANTIGEN (URGENT CARE ONLY) - Abnormal; Notable for the following components:      Result Value   INFLUENZA A ANTIGEN, POC POSITIVE (*)    All other components within normal limits    EKG   Radiology No results found.  Procedures Procedures (including critical care time)  Medications Ordered in UC Medications - No data to display  Initial Impression / Assessment and Plan / UC Course  I have reviewed the triage vital signs and the nursing notes.  Pertinent labs & imaging results that were available during my care of the patient were reviewed by me and considered in my medical decision making (see chart for details).    Test positive in office.  Will treat with Tamiflu given continued symptoms.  Recommend follow-up if symptoms fail to improve or worsen.  Encouraged symptomatic treatment if needed.  Final Clinical Impressions(s) / UC Diagnoses   Final diagnoses:  Influenza   Discharge Instructions   None    ED Prescriptions     Medication Sig Dispense Auth. Provider   oseltamivir (TAMIFLU) 75 MG capsule Take 1 capsule (75 mg total) by mouth every 12 (twelve) hours. 10 capsule Tomi Bamberger, PA-C      PDMP not reviewed this encounter.   Tomi Bamberger, PA-C 08/09/21 1415

## 2021-08-09 NOTE — ED Triage Notes (Signed)
Pt c/o body aches, headache, eye pain, cough, nasal congestion with drainage, earache,   Denies sore throat, nausea, vomiting, diarrhea, constipation.  Onset ~ last Friday

## 2024-04-05 ENCOUNTER — Inpatient Hospital Stay (HOSPITAL_COMMUNITY)
Admission: AD | Admit: 2024-04-05 | Discharge: 2024-04-06 | Disposition: A | Discharge status: Home / Self Care | Payer: Self-pay | Attending: Obstetrics and Gynecology | Admitting: Obstetrics and Gynecology

## 2024-04-05 ENCOUNTER — Encounter (HOSPITAL_COMMUNITY): Payer: Self-pay | Admitting: Obstetrics and Gynecology

## 2024-04-05 DIAGNOSIS — O26891 Other specified pregnancy related conditions, first trimester: Secondary | ICD-10-CM | POA: Insufficient documentation

## 2024-04-05 DIAGNOSIS — Z3A01 Less than 8 weeks gestation of pregnancy: Secondary | ICD-10-CM | POA: Insufficient documentation

## 2024-04-05 DIAGNOSIS — K0889 Other specified disorders of teeth and supporting structures: Secondary | ICD-10-CM | POA: Insufficient documentation

## 2024-04-05 NOTE — MAU Note (Signed)
 MAU Triage Note:  .Andrea Caldwell is a 33 y.o. at Unknown here in MAU reporting: +HPT yesterday. Went to the dentist today for tooth pain and was told that she would need to be seen by her OBGYN prior to having imaging done. Denies VB or LOF. Denies any pregnancy related complaints.   Patient complaint: tooth pain  Pain Score: 9  Pain Location: Teeth     Onset of complaint: 2 days ago LMP: Patient's last menstrual period was 03/03/2024.  Vitals:   04/05/24 2350  BP: 129/69  Pulse: 80  Resp: 20  Temp: 98.3 F (36.8 C)  SpO2: 100%     Lab orders placed from triage: N/A

## 2024-04-06 DIAGNOSIS — K0889 Other specified disorders of teeth and supporting structures: Secondary | ICD-10-CM

## 2024-04-06 DIAGNOSIS — O26891 Other specified pregnancy related conditions, first trimester: Secondary | ICD-10-CM

## 2024-04-06 DIAGNOSIS — Z3A01 Less than 8 weeks gestation of pregnancy: Secondary | ICD-10-CM

## 2024-04-06 LAB — POCT PREGNANCY, URINE: Preg Test, Ur: POSITIVE — AB

## 2024-04-06 MED ORDER — ACETAMINOPHEN 500 MG PO TABS
1000.0000 mg | ORAL_TABLET | Freq: Four times a day (QID) | ORAL | 0 refills | Status: DC | PRN
Start: 2024-04-06 — End: 2024-04-20

## 2024-04-06 MED ORDER — LIDOCAINE VISCOUS HCL 2 % MT SOLN: 15.0000 mL | OROMUCOSAL | 0 refills | Status: AC | PRN

## 2024-04-06 NOTE — MAU Provider Note (Signed)
 Chief Complaint: Dental Pain  SUBJECTIVE HPI: Andrea Caldwell is a 33 y.o. G3P1102 at [redacted]w[redacted]d by LMP who presents to maternity admissions reporting +HPT yesterday and tooth pain 9/10 today. Seen by the dentist who refused to treat tooth pain until cleared by OB/GYN for dental procedures and imaging. Denies abdominal pain or cramping.  She denies vaginal bleeding, vaginal itching/burning, urinary symptoms, h/a, dizziness, n/v, or fever/chills.    HPI  Past Medical History:  Diagnosis Date   Headache    Preterm labor    delivered 1 month early with first baby   Past Surgical History:  Procedure Laterality Date   NO PAST SURGERIES     Social History   Socioeconomic History   Marital status: Single    Spouse name: Not on file   Number of children: Not on file   Years of education: Not on file   Highest education level: Not on file  Occupational History   Not on file  Tobacco Use   Smoking status: Never   Smokeless tobacco: Never  Vaping Use   Vaping status: Never Used  Substance and Sexual Activity   Alcohol use: No   Drug use: No   Sexual activity: Yes    Partners: Male    Birth control/protection: None  Other Topics Concern   Not on file  Social History Narrative   Not on file   Social Drivers of Health   Financial Resource Strain: Not on file  Food Insecurity: Not on file  Transportation Needs: Not on file  Physical Activity: Not on file  Stress: Not on file  Social Connections: Not on file  Intimate Partner Violence: Not on file   No current facility-administered medications on file prior to encounter.   Current Outpatient Medications on File Prior to Encounter  Medication Sig Dispense Refill   docusate sodium  (COLACE) 100 MG capsule Take 1 capsule (100 mg total) by mouth 2 (two) times daily. 10 capsule 0   ferrous sulfate  325 (65 FE) MG tablet Take 1 tablet (325 mg total) by mouth 2 (two) times daily with a meal. 60 tablet 3   ibuprofen  (ADVIL ,MOTRIN )  600 MG tablet Take 1 tablet (600 mg total) by mouth every 6 (six) hours. 30 tablet 0   oseltamivir  (TAMIFLU ) 75 MG capsule Take 1 capsule (75 mg total) by mouth every 12 (twelve) hours. 10 capsule 0   No Known Allergies  ROS:  Pertinent positives/negatives listed above.  I have reviewed patient's Past Medical Hx, Surgical Hx, Family Hx, Social Hx, medications and allergies.   Physical Exam  Patient Vitals for the past 24 hrs:  BP Temp Temp src Pulse Resp SpO2 Height Weight  04/05/24 2350 129/69 98.3 F (36.8 C) Oral 80 20 100 % 5' 4 (1.626 m) 64.3 kg   Constitutional: Well-developed, well-nourished female in no acute distress.  Cardiovascular: normal rate Respiratory: normal effort GI: Abd soft, non-tender. Pos BS x 4 MS: Extremities nontender, no edema, normal ROM Neurologic: Alert and oriented x 4.  GU: Neg CVAT.  LAB RESULTS Results for orders placed or performed during the hospital encounter of 04/05/24 (from the past 24 hours)  Pregnancy, urine POC     Status: Abnormal   Collection Time: 04/06/24 12:20 AM  Result Value Ref Range   Preg Test, Ur POSITIVE (A) NEGATIVE       IMAGING No results found.  MAU Management/MDM: Orders Placed This Encounter  Procedures   Pregnancy, urine POC    Meds ordered  this encounter  Medications   acetaminophen  (TYLENOL ) 500 MG tablet    Sig: Take 2 tablets (1,000 mg total) by mouth every 6 (six) hours as needed.    Dispense:  30 tablet    Refill:  0   lidocaine  (XYLOCAINE ) 2 % solution    Sig: Use as directed 15 mLs in the mouth or throat as needed for up to 5 days for mouth pain. Rinse and spit.    Dispense:  40 mL    Refill:  0   ASSESSMENT 1. [redacted] weeks gestation of pregnancy   2. Tooth pain    Early pregnant in need of urgent dental care. Clearance letter provided for dental care deemed appropriate by Dentist and/or Endodontist   PLAN Discharge home with strict return precautions. Establish provider for prenatal care,  list provided.  Allergies as of 04/06/2024   No Known Allergies      Medication List     STOP taking these medications    ibuprofen  600 MG tablet Commonly known as: ADVIL        TAKE these medications    acetaminophen  500 MG tablet Commonly known as: TYLENOL  Take 2 tablets (1,000 mg total) by mouth every 6 (six) hours as needed.   docusate sodium  100 MG capsule Commonly known as: COLACE Take 1 capsule (100 mg total) by mouth 2 (two) times daily.   ferrous sulfate  325 (65 FE) MG tablet Take 1 tablet (325 mg total) by mouth 2 (two) times daily with a meal.   lidocaine  2 % solution Commonly known as: XYLOCAINE  Use as directed 15 mLs in the mouth or throat as needed for up to 5 days for mouth pain. Rinse and spit.   oseltamivir  75 MG capsule Commonly known as: TAMIFLU  Take 1 capsule (75 mg total) by mouth every 12 (twelve) hours.        Mardy Shropshire, MD FMOB Fellow, Faculty practice [facility], Center for [facility]  04/06/2024  12:58 AM

## 2024-04-06 NOTE — Discharge Instructions (Signed)

## 2024-04-19 ENCOUNTER — Inpatient Hospital Stay (HOSPITAL_COMMUNITY)
Admission: AD | Admit: 2024-04-19 | Discharge: 2024-04-19 | Disposition: A | Discharge status: Home / Self Care | Payer: Self-pay | Attending: Obstetrics & Gynecology | Admitting: Obstetrics & Gynecology

## 2024-04-19 ENCOUNTER — Encounter (HOSPITAL_COMMUNITY): Payer: Self-pay | Admitting: Obstetrics & Gynecology

## 2024-04-19 ENCOUNTER — Inpatient Hospital Stay (HOSPITAL_COMMUNITY): Payer: Self-pay

## 2024-04-19 ENCOUNTER — Other Ambulatory Visit: Payer: Self-pay

## 2024-04-19 DIAGNOSIS — Z603 Acculturation difficulty: Secondary | ICD-10-CM

## 2024-04-19 DIAGNOSIS — O418X10 Other specified disorders of amniotic fluid and membranes, first trimester, not applicable or unspecified: Secondary | ICD-10-CM

## 2024-04-19 DIAGNOSIS — O368310 Maternal care for abnormalities of the fetal heart rate or rhythm, first trimester, not applicable or unspecified: Secondary | ICD-10-CM | POA: Insufficient documentation

## 2024-04-19 DIAGNOSIS — O26852 Spotting complicating pregnancy, second trimester: Secondary | ICD-10-CM | POA: Insufficient documentation

## 2024-04-19 DIAGNOSIS — O3680X0 Pregnancy with inconclusive fetal viability, not applicable or unspecified: Secondary | ICD-10-CM | POA: Insufficient documentation

## 2024-04-19 DIAGNOSIS — Z3A01 Less than 8 weeks gestation of pregnancy: Secondary | ICD-10-CM

## 2024-04-19 DIAGNOSIS — O209 Hemorrhage in early pregnancy, unspecified: Secondary | ICD-10-CM

## 2024-04-19 DIAGNOSIS — Z3A14 14 weeks gestation of pregnancy: Secondary | ICD-10-CM | POA: Insufficient documentation

## 2024-04-19 DIAGNOSIS — O200 Threatened abortion: Secondary | ICD-10-CM | POA: Insufficient documentation

## 2024-04-19 LAB — URINALYSIS, ROUTINE W REFLEX MICROSCOPIC
Bilirubin Urine: NEGATIVE
Glucose, UA: NEGATIVE mg/dL
Ketones, ur: NEGATIVE mg/dL
Nitrite: NEGATIVE
Protein, ur: NEGATIVE mg/dL
Specific Gravity, Urine: 1.005 (ref 1.005–1.030)
pH: 6 (ref 5.0–8.0)

## 2024-04-19 LAB — CBC
HCT: 40.9 % (ref 36.0–46.0)
Hemoglobin: 13.6 g/dL (ref 12.0–15.0)
MCH: 27.9 pg (ref 26.0–34.0)
MCHC: 33.3 g/dL (ref 30.0–36.0)
MCV: 83.8 fL (ref 80.0–100.0)
Platelets: 310 K/uL (ref 150–400)
RBC: 4.88 MIL/uL (ref 3.87–5.11)
RDW: 12.5 % (ref 11.5–15.5)
WBC: 10.1 K/uL (ref 4.0–10.5)
nRBC: 0 % (ref 0.0–0.2)

## 2024-04-19 LAB — WET PREP, GENITAL
Clue Cells Wet Prep HPF POC: NONE SEEN
Sperm: NONE SEEN
Trich, Wet Prep: NONE SEEN
WBC, Wet Prep HPF POC: <10 (ref <10)
Yeast Wet Prep HPF POC: NONE SEEN

## 2024-04-19 LAB — HCG, QUANTITATIVE, PREGNANCY: hCG, Beta Chain, Quant, S: 1648 m[IU]/mL — ABNORMAL HIGH (ref <5)

## 2024-04-19 NOTE — MAU Note (Signed)
 Andrea Caldwell is a 33 y.o. at [redacted]w[redacted]d here in MAU reporting: VB that started yesterday and continued today- spotting on tissue. No abd pain reported. Has denist appt tomorrow and wants to know if it is safe to go?   LMP: 03/03/24 Onset of complaint: yesterday Pain score: 0/10 Vitals:   04/19/24 1648  BP: (!) 125/56  Pulse: 68  Resp: 16  Temp: 98.9 F (37.2 C)  SpO2: 100%     FHT: na  Lab orders placed from triage: ua swabs

## 2024-04-19 NOTE — Discharge Instructions (Signed)
 Return to MAU: If you have heavier bleeding that soaks through more that 2 pads per hour for an hour or more If you bleed so much that you feel like you might pass out or you do pass out If you have significant abdominal pain that is not improved with Tylenol 1000 mg every 8 hours as needed for pain If you develop a fever > 100.5

## 2024-04-19 NOTE — MAU Provider Note (Signed)
 History     CSN: [id number]  Arrival date and time: 04/19/24 1542   Event Date/Time   First Provider Initiated Contact with Patient 04/19/24 1706      Chief Complaint  Patient presents with   Vaginal Bleeding   HPI [name] is a 32 y.o. year old G54P1102 female at [redacted]w[redacted]d weeks gestation who presents to MAU reporting VB that started yesterday and continues today. She describes the VB as staining her underwear yesterday, but just on the tissue when she wipes today. She denies abdominal pain. She also has a dentist appt tomorrow that may involve X-rays of mouth. She wants to know if it is ok to go to that appt tomorrow? Her spouse is present and contributing to the history taking.    OB History     Gravida  3   Para  2   Term  1   Preterm  1   AB      Living  2      SAB      IAB      Ectopic      Multiple  0   Live Births  2           Past Medical History:  Diagnosis Date   Headache    Preterm labor    delivered 1 month early with first baby    Past Surgical History:  Procedure Laterality Date   NO PAST SURGERIES      Family History  Problem Relation Age of Onset   Cancer Mother    Hyperlipidemia Father    Heart disease Father    Diabetes Maternal Grandmother    Kidney disease Maternal Aunt    Diabetes Paternal Uncle     Social History   Tobacco Use   Smoking status: Never   Smokeless tobacco: Never  Vaping Use   Vaping status: Never Used  Substance Use Topics   Alcohol use: No   Drug use: No    Allergies: No Known Allergies  Medications Prior to Admission  Medication Sig Dispense Refill Last Dose/Taking   acetaminophen  (TYLENOL ) 500 MG tablet Take 2 tablets (1,000 mg total) by mouth every 6 (six) hours as needed. 30 tablet 0    docusate sodium  (COLACE) 100 MG capsule Take 1 capsule (100 mg total) by mouth 2 (two) times daily. 10 capsule 0    ferrous sulfate  325 (65 FE) MG tablet Take 1 tablet (325 mg total) by mouth 2  (two) times daily with a meal. 60 tablet 3    oseltamivir  (TAMIFLU ) 75 MG capsule Take 1 capsule (75 mg total) by mouth every 12 (twelve) hours. 10 capsule 0     Review of Systems  Constitutional: Negative.   HENT: Negative.    Eyes: Negative.   Respiratory: Negative.    Cardiovascular: Negative.   Gastrointestinal: Negative.   Endocrine: Negative.   Genitourinary:  Positive for vaginal bleeding (with wiping).  Musculoskeletal: Negative.   Skin: Negative.   Allergic/Immunologic: Negative.   Neurological: Negative.   Hematological: Negative.   Psychiatric/Behavioral: Negative.     Physical Exam   Blood pressure (!) 125/56, pulse 68, temperature 98.9 F (37.2 C), temperature source Oral, resp. rate 16, height 5' 4 (1.626 m), weight 65.7 kg, last menstrual period 03/03/2024, SpO2 100%.  Physical Exam Vitals and nursing note reviewed.  Constitutional:      Appearance: Normal appearance. She is normal weight.  Cardiovascular:     Rate and Rhythm:  Normal rate.  Pulmonary:     Effort: Pulmonary effort is normal.  Abdominal:     Palpations: Abdomen is soft.  Musculoskeletal:        General: Normal range of motion.  Skin:    General: Skin is warm and dry.  Neurological:     Mental Status: She is alert and oriented to person, place, and time.  Psychiatric:        Mood and Affect: Mood normal.        Behavior: Behavior normal.        Thought Content: Thought content normal.        Judgment: Judgment normal.     MAU Course  Procedures  MDM CCUA UPT CBC ABO/Rh HCG Wet Prep GC/CT -- Results pending  RPR -- Results pending  OB U/S < 14 wks TVUS  Results for orders placed or performed during the hospital encounter of 04/19/24 (from the past 24 hours)  Wet prep, genital     Status: None   Collection Time: 04/19/24  4:51 PM   Specimen: PATH Cytology Cervicovaginal Ancillary Only  Result Value Ref Range   Yeast Wet Prep HPF POC NONE SEEN NONE SEEN   Trich, Wet Prep  NONE SEEN NONE SEEN   Clue Cells Wet Prep HPF POC NONE SEEN NONE SEEN   WBC, Wet Prep HPF POC <10 <10   Sperm NONE SEEN   Urinalysis, Routine w reflex microscopic -Urine, Clean Catch     Status: Abnormal   Collection Time: 04/19/24  4:51 PM  Result Value Ref Range   Color, Urine YELLOW YELLOW   APPearance HAZY (A) CLEAR   Specific Gravity, Urine 1.005 1.005 - 1.030   pH 6.0 5.0 - 8.0   Glucose, UA NEGATIVE NEGATIVE mg/dL   Hgb urine dipstick LARGE (A) NEGATIVE   Bilirubin Urine NEGATIVE NEGATIVE   Ketones, ur NEGATIVE NEGATIVE mg/dL   Protein, ur NEGATIVE NEGATIVE mg/dL   Nitrite NEGATIVE NEGATIVE   Leukocytes,Ua TRACE (A) NEGATIVE   RBC / HPF 0-5 0 - 5 RBC/hpf   WBC, UA 6-10 0 - 5 WBC/hpf   Bacteria, UA RARE (A) NONE SEEN   Squamous Epithelial / HPF 0-5 0 - 5 /HPF   Mucus PRESENT    Hyaline Casts, UA PRESENT   ABO/Rh     Status: None   Collection Time: 04/19/24  5:46 PM  Result Value Ref Range   ABO/RH(D)      A POS Performed at [facility], [address], [place], [place] [address]   CBC     Status: None   Collection Time: 04/19/24  5:49 PM  Result Value Ref Range   WBC 10.1 4.0 - 10.5 K/uL   RBC 4.88 3.87 - 5.11 MIL/uL   Hemoglobin 13.6 12.0 - 15.0 g/dL   HCT 59.0 63.9 - 53.9 %   MCV 83.8 80.0 - 100.0 fL   MCH 27.9 26.0 - 34.0 pg   MCHC 33.3 30.0 - 36.0 g/dL   RDW 87.4 88.4 - 84.4 %   Platelets 310 150 - 400 K/uL   nRBC 0.0 0.0 - 0.2 %  hCG, quantitative, pregnancy     Status: Abnormal   Collection Time: 04/19/24  5:49 PM  Result Value Ref Range   hCG, Beta Chain, Quant, S 1,648 (H) <5 mIU/mL    US  OB LESS THAN 14 WEEKS WITH OB TRANSVAGINAL Result Date: 04/19/2024 CLINICAL DATA:  First trimester, spotting EXAM: OBSTETRIC <14 WK US   AND TRANSVAGINAL OB US  TECHNIQUE: Both transabdominal and transvaginal ultrasound examinations were performed for complete evaluation of the gestation as well as the maternal uterus, adnexal regions, and pelvic cul-de-sac.  Transvaginal technique was performed to assess early pregnancy. COMPARISON:  None FINDINGS: Intrauterine gestational sac: Present, single Yolk sac:  Present Embryo:  Present Cardiac Activity: Present Heart Rate: Poorly visualized, measured rate of 93 beats per minute is not considered definitive CRL:  4.3 mm   6 w   1 d                  US  EDC: 12/12/2024 Subchorionic hemorrhage:  Small amount of subchorionic hemorrhage Maternal uterus/adnexae: Maternal right ovary not visualized. Left ovary unremarkable. IMPRESSION: 1. Single intrauterine pregnancy with visible embryo measuring at 6 weeks 1 day gestation. Cardiac activity faintly seen but measurement of 93 beats per minute (which would imply fetal bradycardia) is not considered highly reliable/definitive as the signal is poorly characterized. Surveillance sonography to ensure viability may be warranted. 2. Small amount of subchorionic hemorrhage. Electronically Signed   By: Ryan Salvage M.D.   On: 04/19/2024 18:57    Assessment and Plan  1. Vaginal bleeding affecting early pregnancy (Primary) - Information provided on vaginal bleeding in pregnancy - Return to MAU: If you have heavier bleeding that soaks through more that 2 pads per hour for an hour or more If you bleed so much that you feel like you might pass out or you do pass out If you have significant abdominal pain that is not improved with Tylenol  1000 mg every 8 hours as needed for pain If you develop a fever > 100.5    2. Subchorionic hematoma in first trimester, single or unspecified fetus - Information provided on [facility]   3. Threatened miscarriage in early pregnancy - Information provided on threatened miscarriage   4. Pregnancy with uncertain fetal viability, single or unspecified fetus - Needs viability U/S in 2 weeks; possibly with NOB intake  5. Language barrier affecting health care - [place] In-Person Spanish Interpreter, Shanda, used for entire visit   6. [redacted] weeks  gestation of pregnancy   - Discharge home - Keep scheduled appt with [facility] on 05/02/2024 - Patient verbalized an understanding of the plan of care and agrees.   Emalynn Clewis, CNM 04/19/2024, 5:06 PM

## 2024-04-20 ENCOUNTER — Inpatient Hospital Stay (HOSPITAL_COMMUNITY)
Admission: AD | Admit: 2024-04-20 | Discharge: 2024-04-20 | Disposition: A | Discharge status: Home / Self Care | Payer: Self-pay | Attending: Obstetrics and Gynecology | Admitting: Obstetrics and Gynecology

## 2024-04-20 ENCOUNTER — Inpatient Hospital Stay (HOSPITAL_COMMUNITY): Payer: Self-pay

## 2024-04-20 DIAGNOSIS — O039 Complete or unspecified spontaneous abortion without complication: Secondary | ICD-10-CM

## 2024-04-20 DIAGNOSIS — N939 Abnormal uterine and vaginal bleeding, unspecified: Secondary | ICD-10-CM

## 2024-04-20 LAB — WET PREP, GENITAL
Sperm: NONE SEEN
Trich, Wet Prep: NONE SEEN
WBC, Wet Prep HPF POC: <10 (ref <10)
Yeast Wet Prep HPF POC: NONE SEEN

## 2024-04-20 LAB — URINALYSIS, ROUTINE W REFLEX MICROSCOPIC
Bilirubin Urine: NEGATIVE
Glucose, UA: NEGATIVE mg/dL
Ketones, ur: NEGATIVE mg/dL
Nitrite: NEGATIVE
Protein, ur: NEGATIVE mg/dL
Specific Gravity, Urine: 1.005 (ref 1.005–1.030)
pH: 6 (ref 5.0–8.0)

## 2024-04-20 LAB — CBC
HCT: 40 % (ref 36.0–46.0)
Hemoglobin: 13.3 g/dL (ref 12.0–15.0)
MCH: 27.8 pg (ref 26.0–34.0)
MCHC: 33.3 g/dL (ref 30.0–36.0)
MCV: 83.7 fL (ref 80.0–100.0)
Platelets: 307 K/uL (ref 150–400)
RBC: 4.78 MIL/uL (ref 3.87–5.11)
RDW: 12.4 % (ref 11.5–15.5)
WBC: 11.8 K/uL — ABNORMAL HIGH (ref 4.0–10.5)
nRBC: 0 % (ref 0.0–0.2)

## 2024-04-20 LAB — ABO/RH: ABO/RH(D): A POS

## 2024-04-20 LAB — HCG, QUANTITATIVE, PREGNANCY: hCG, Beta Chain, Quant, S: 1665 m[IU]/mL — ABNORMAL HIGH (ref <5)

## 2024-04-20 MED ORDER — ONDANSETRON 8 MG PO TBDP: 8.0000 mg | ORAL_TABLET | Freq: Three times a day (TID) | ORAL | 0 refills | Status: AC | PRN

## 2024-04-20 MED ORDER — OXYCODONE HCL 5 MG PO TABS: 5.0000 mg | ORAL_TABLET | ORAL | 0 refills | Status: AC | PRN

## 2024-04-20 MED ORDER — OXYCODONE HCL 5 MG PO TABS: 5.0000 mg | ORAL_TABLET | ORAL | 0 refills | Status: DC | PRN

## 2024-04-20 MED ORDER — ONDANSETRON 8 MG PO TBDP: 8.0000 mg | ORAL_TABLET | Freq: Three times a day (TID) | ORAL | 0 refills | Status: DC | PRN

## 2024-04-20 MED ORDER — ACETAMINOPHEN 500 MG PO TABS: 1000.0000 mg | ORAL_TABLET | Freq: Four times a day (QID) | ORAL | 0 refills | Status: AC | PRN

## 2024-04-20 MED ORDER — IBUPROFEN 800 MG PO TABS: 800.0000 mg | ORAL_TABLET | Freq: Three times a day (TID) | ORAL | 3 refills | Status: AC | PRN

## 2024-04-20 MED ORDER — IBUPROFEN 800 MG PO TABS: 800.0000 mg | ORAL_TABLET | Freq: Three times a day (TID) | ORAL | 3 refills | Status: DC | PRN

## 2024-04-20 NOTE — MAU Provider Note (Signed)
 None     S Andrea Caldwell is a 33 y.o. [phone] female at [redacted]w[redacted]d who presents to MAU today with complaint of increased vaginal bleeding. She was evaluated yesterday for vaginal bleeding, but today bleeding became heavier and worried the patient. She has also been feeling chills. She endorses using 4 Maxi pads total today and has associated lower abdominal and lower back pain.   Due to language barrier, an in-person interpreter was present during the history-taking, physical exam, and subsequent discussion with this patient.    Pertinent items noted in HPI and remainder of comprehensive ROS otherwise negative.   O BP 127/65   Pulse 76   Temp 98.9 F (37.2 C)   Resp 16   Ht 5' 4 (1.626 m)   Wt 64.4 kg   LMP 03/03/2024   SpO2 100%   BMI 24.37 kg/m  Physical Exam Vitals reviewed.  Constitutional:      General: She is not in acute distress.    Appearance: She is well-developed. She is not diaphoretic.  Eyes:     General: No scleral icterus. Pulmonary:     Effort: Pulmonary effort is normal. No respiratory distress.  Skin:    General: Skin is warm and dry.  Neurological:     Mental Status: She is alert.     Coordination: Coordination normal.    Results for orders placed or performed during the hospital encounter of 04/20/24 (from the past 24 hours)  CBC     Status: Abnormal   Collection Time: 04/20/24  7:19 PM  Result Value Ref Range   WBC 11.8 (H) 4.0 - 10.5 K/uL   RBC 4.78 3.87 - 5.11 MIL/uL   Hemoglobin 13.3 12.0 - 15.0 g/dL   HCT 59.9 63.9 - 53.9 %   MCV 83.7 80.0 - 100.0 fL   MCH 27.8 26.0 - 34.0 pg   MCHC 33.3 30.0 - 36.0 g/dL   RDW 87.5 88.4 - 84.4 %   Platelets 307 150 - 400 K/uL   nRBC 0.0 0.0 - 0.2 %  hCG, quantitative, pregnancy     Status: Abnormal   Collection Time: 04/20/24  7:19 PM  Result Value Ref Range   hCG, Beta Chain, Quant, S 1,665 (H) <5 mIU/mL  Wet prep, genital     Status: Abnormal   Collection Time: 04/20/24  8:20 PM   Specimen:  PATH Cytology Cervicovaginal Ancillary Only  Result Value Ref Range   Yeast Wet Prep HPF POC NONE SEEN NONE SEEN   Trich, Wet Prep NONE SEEN NONE SEEN   Clue Cells Wet Prep HPF POC PRESENT (A) NONE SEEN   WBC, Wet Prep HPF POC <10 <10   Sperm NONE SEEN   Urinalysis, Routine w reflex microscopic -Urine, Clean Catch     Status: Abnormal   Collection Time: 04/20/24  9:17 PM  Result Value Ref Range   Color, Urine YELLOW YELLOW   APPearance CLEAR CLEAR   Specific Gravity, Urine 1.005 1.005 - 1.030   pH 6.0 5.0 - 8.0   Glucose, UA NEGATIVE NEGATIVE mg/dL   Hgb urine dipstick LARGE (A) NEGATIVE   Bilirubin Urine NEGATIVE NEGATIVE   Ketones, ur NEGATIVE NEGATIVE mg/dL   Protein, ur NEGATIVE NEGATIVE mg/dL   Nitrite NEGATIVE NEGATIVE   Leukocytes,Ua TRACE (A) NEGATIVE   RBC / HPF 21-50 0 - 5 RBC/hpf   WBC, UA 0-5 0 - 5 WBC/hpf   Bacteria, UA RARE (A) NONE SEEN   Squamous Epithelial /  HPF 0-5 0 - 5 /HPF   Mucus PRESENT     MDM: Moderate MAU Course: -Vital signs within normal limits. Afebrile. -Wet prep shows BV, will treat with metronidazole. -US  shows interval passage of previously visualized IUP. -Discussed that findings are consistent with miscarriage. Reassured patient that miscarriage is common with ~1/4 of women experiencing it in their lifetime. Reassured patient that there is nothing she did or did not do to cause this. Reviewed most common reason is presumed to be genetic abnormalities that allow a pregnancy to start but not continue past an early stage, but realistically we do not know the cause in most cases. Reviewed that studies show no definite difference between attempting another pregnancy sooner vs waiting, though some studies do show better live birth outcomes with trying sooner. Reviewed options of expectant, medical, or surgical management.   -Patient would like to proceed with expectant management at this time.  A 1. Spontaneous abortion (Primary) - Discharge  patient  2. Vaginal bleeding - Discharge patient  Medical screening exam complete  P Discharge from MAU in stable condition with infection and bleeding precautions Follow up in office in 2 weeks. Ibuprofen  and Tylenol  for primary pain management, oxycodone  sent for severe breakthrough pain. Zofran  sent for nausea.  Future Appointments  Date Time Provider Department Center  05/02/2024 10:15 AM [facility] INTAKE [facility] None   Allergies as of 04/20/2024   No Known Allergies      Medication List     TAKE these medications    acetaminophen  500 MG tablet Commonly known as: TYLENOL  Take 2 tablets (1,000 mg total) by mouth every 6 (six) hours as needed.   ibuprofen  800 MG tablet Commonly known as: ADVIL  Take 1 tablet (800 mg total) by mouth 3 (three) times daily with meals as needed for headache, moderate pain (pain score 4-6) or cramping.   ondansetron  8 MG disintegrating tablet Commonly known as: ZOFRAN -ODT Take 1 tablet (8 mg total) by mouth every 8 (eight) hours as needed for nausea or vomiting.   oxyCODONE  5 MG immediate release tablet Commonly known as: Oxy IR/ROXICODONE  Take 1 tablet (5 mg total) by mouth every 4 (four) hours as needed for severe pain (pain score 7-10) or breakthrough pain.        Joesph DELENA Sear, PA

## 2024-04-20 NOTE — MAU Note (Signed)
 Andrea Caldwell is a 33 y.o. at [redacted]w[redacted]d here in MAU reporting coming yesterday due to VB. Has returned tonight as bleeding is heavier. Having lower abd and lower back pain and is very cold. Has used 4 pads today  LMP: na Onset of complaint: yesterday Pain score: 3 Vitals:   04/20/24 2029 04/20/24 2031  BP:  127/65  Pulse: 76   Resp: 16   Temp: 98.9 F (37.2 C)   SpO2: 100%      FHT: na  Lab orders placed from triage: u/a

## 2024-04-20 NOTE — Discharge Instructions (Addendum)
 Lamento mucho que esto est sucediendo. Puede que experimentes muchas emociones diferentes mientras ests de luto, y es normal tener estrs emocional despus de la prdida de un embarazo. La recuperacin emocional puede llevar ms tiempo que la recuperacin fsica. No hay nada que hayas hecho o dejado de hacer que haya causado esto; no es tu culpa. Por favor, contacta si necesitas apoyo, estamos aqu para ti. Tambin puedes unirte a un grupo para ayudarte a lidiar con tu duelo: [email or web address]   Puedes llamar a cualquiera de estos nmeros de telfono maana si deseas continuar con la medicacin o la ciruga.   [facility]                                                             [address], [address], [place], [place], [address] [phone]  Maternal Assessment Unit at [facility] [phone]   PRDIDA [facility] DEL [facility] Una vez que el vulo es fertilizado por el esperma y comienza a Engineer, building services, se adhiere al recubrimiento del tero. Este tejido de embarazo temprano puede no desarrollarse en un embrin (la etapa inicial de un beb). A veces, se desarrolla un embrin, pero no contina creciendo. Estos problemas pueden ser vistos en ignacia chiles.Aproximadamente 1 de cada 4 mujeres sufrir una prdida de [employer] vida. Se ha encontrado que 1 de cada 5 embarazos es un fallo temprano del [place].  GESTIONANDO LA PRDIDA TEMPRANA DEL EMBARAZO La decisin sobre cmo proceder despus de ser diagnosticado con un fallo temprano del embarazo es personal. No hay una respuesta correcta o incorrecta; puedes tomar la decisin que mejor funcione para ti. Existen tres opciones para Database administrator una prdida temprana del embarazo (aborto espontneo):  1) MANEJO EXPECTANTE: Esta es la situacin en la  que esperas 2 semanas para ver si el cuerpo expulsa el embarazo por s mismo Puedes elegir esperar, en cuyo caso tu propio cuerpo puede completar la expulsin del embarazo temprano anormal por s solo en aproximadamente 2-4 semanas  Es posible que tu sangrado sea abundante en ocasiones  Si decides que te Manufacturing systems engineer Cytotec, por favor llama a la oficina y lo enviaremos a la farmacia por ti.  Existe una pequea posibilidad de que necesites ciruga si el sangrado es excesivo o no todo el embarazo ha sido expulsado.  2) MEDICAMENTO (CYTOTEC): este es un medicamento que causa calambres en el tero y puede ayudar a expulsar el embarazo.   Los prostaglandinas (Cytotec) causan que el tero se contraiga y tenga calambres. Te colocars el medicamento t misma dentro de tu vagina en la privacidad de tu hogar. La expulsin del tero debera ocurrir dentro de [address]  [address], pero el proceso puede continuar durante varias semanas.   Este medicamento aumenta la probabilidad de que expulses el embarazo. La tasa de fracaso es de aproximadamente 15-20%.   Acorta el tiempo para expulsar tu embarazo, generalmente dentro de 24 horas, y puedes repetir la dosis si es necesario.   El procedimiento completo puede tomar das a semanas   Sin ciruga   El sangrado puede ser abundante en ocasiones   Pueden haber efectos secundarios del medicamento o nuseas o vmitos o diarrea o escalofros o fiebre, sofocos  La paciente tiene ms control  Riesgo bajo: menos de 1  en 100 mujeres tiene una complicacin   Si el embarazo no se expulsa completamente, es posible que necesites [place].  3) CIRUGA (D&C): este es un procedimiento quirrgico para Tree surgeon del tero   El procedimiento termina en 1 da   Requiere anestesia general   El sangrado puede ser ligero   Posibles problemas durante la [place], incluida la lesin del tero.   Has decidido hacer  MANEJO EXPECTANTE  QU ESPERAR A MEDIDA QUE AVANCE EL EMBARAZO:   [place]  Sangrado  Dolores de cabeza  Mareos Para ayudar con estos sntomas, se recetarn tres medicamentos. Puedes tomar ibuprofeno para la Warehouse manager, pero usa  Percocet si es necesario para el dolor severo que no se controla con ibuprofeno. Puedes tomar Zofran  para cualquier nusea. Tambin puedes tomar Tylenol  para el dolor si lo prefieres.  DESPUS DE QUE PASA EL EMBARAZO:  Todos se sentirn de [address] diferente despus de que el embarazo temprano pase.  Puedes tener dolor o calambres durante uno o [address].  Puedes tener sangrado ligero durante hasta 2 semanas.  Puedes estar tan activa como te sientas.  Debes tener un examen de seguimiento con tu gineclogo en aproximadamente 2 semanas para asegurarte de que te ests recuperando adecuadamente.  Se espera que tu prximo perodo comience nuevamente en aproximadamente 4-6 semanas despus.  Es posible quedar embarazada pronto despus de una prdida.   Razones para Hotel manager a MAU en el [facility] [place] [facility] [place]:   Si tienes sangrado ms abundante que empapa ms de 2 toallas sanitarias de tamao completo por hora durante una hora o ms.   Si sangras tanto que sientes que podras desmayarte o si te desmayas.   Si sientes mareos o aturdimiento.   Si tienes un dolor abdominal significativo que no mejora con Tylenol  1000 mg cada 8 horas, segn sea necesario para el dolor.   Si desarrollas fiebre > 100.5.   Si tienes flujo vaginal maloliente.

## 2024-04-21 LAB — GC/CHLAMYDIA PROBE AMP (~~LOC~~) NOT AT ARMC
Chlamydia: NEGATIVE
Chlamydia: NEGATIVE
Comment: NEGATIVE
Comment: NORMAL
Comment: NEGATIVE
Comment: NORMAL
Neisseria Gonorrhea: NEGATIVE
Neisseria Gonorrhea: NEGATIVE

## 2024-05-05 ENCOUNTER — Encounter: Payer: Self-pay | Admitting: Family Medicine

## 2024-05-05 ENCOUNTER — Ambulatory Visit: Payer: Self-pay | Admitting: Family Medicine

## 2024-05-05 VITALS — BP 110/44 | HR 65 | Ht 67.0 in | Wt 140.6 lb

## 2024-05-05 DIAGNOSIS — O039 Complete or unspecified spontaneous abortion without complication: Secondary | ICD-10-CM

## 2024-05-05 NOTE — Progress Notes (Signed)
 Pt presents for SAB f/u. Pt denies pain and states bleeding stopped last week.   Pt want to try to conceive next year. Considering BC pills for now.

## 2024-05-05 NOTE — Patient Instructions (Addendum)
 I am so sorry that this is happening. You may experience many different emotions while you grieve, and it is normal to have emotional stress after a pregnancy loss. Emotional recovery can take longer than physical recovery. There is nothing that you did or did not do to cause this to happen; it is not your fault. Please reach out if you need support, we are here for you. You can also join a group to help work through your grief: [email or web address]  Go to the health department for pap smears!

## 2024-05-05 NOTE — Progress Notes (Signed)
   Subjective:   Patient Name: Andrea Caldwell, female   DOB: 1991/04/15, 33 y.o.  MRN: [id number]  HPI Seen for follow up of SAB 2 weeks ago. Denies bleeding, cramping. Both stopped last week. Menses has not returned yet. Plans on becoming pregnant soon. Emotionally, she has been sad but well supported. Her support system consists of her family. She hopes to get pregnant again in the next year.  Review of Systems See HPI    Objective:   Physical Exam  Constitutional: She is oriented to person, place, and time. She appears well-developed and well-nourished.  Cardiovascular: Normal rate.  Abdominal: Soft. She exhibits no distension.  Neurological: She is alert and oriented to person, place, and time.  Skin: Skin is warm and dry.  Psychiatric: She has a normal mood and affect. Her behavior is normal. Judgment and thought content normal.      Assessment & Plan:  1. Spontaneous abortion (Primary) - Discussed common causes of SAB. Reassured her this was nothing in her control.  - Discussed plan for subsequent pregnancy i.e. serials betas and/or early US  (typically around 7-8wks) - Discussed that she should wait for one normal cycle and then they may begin trying again if she feels emotionally ready as well - Answered all questions   Patient counseled regarding future pregnancy plans and timing  Contraception: condoms  Joesph DELENA Sear PA-C 05/05/24  12:30 PM

## 2024-08-03 ENCOUNTER — Ambulatory Visit
Admission: EM | Admit: 2024-08-03 | Discharge: 2024-08-03 | Disposition: A | Discharge status: Home / Self Care | Payer: Self-pay

## 2024-08-03 ENCOUNTER — Encounter: Payer: Self-pay | Admitting: Emergency Medicine

## 2024-08-03 DIAGNOSIS — M545 Low back pain, unspecified: Secondary | ICD-10-CM

## 2024-08-03 DIAGNOSIS — S39012A Strain of muscle, fascia and tendon of lower back, initial encounter: Secondary | ICD-10-CM

## 2024-08-03 LAB — POCT URINE DIPSTICK
Bilirubin, UA: NEGATIVE
Blood, UA: NEGATIVE
Glucose, UA: NEGATIVE mg/dL
Leukocytes, UA: NEGATIVE
Nitrite, UA: NEGATIVE
Spec Grav, UA: 1.02 (ref 1.010–1.025)
Urobilinogen, UA: 0.2 U/dL
pH, UA: 7 (ref 5.0–8.0)

## 2024-08-03 MED ORDER — DICLOFENAC SODIUM 75 MG PO TBEC: 75.0000 mg | DELAYED_RELEASE_TABLET | Freq: Two times a day (BID) | ORAL | 0 refills | Status: AC

## 2024-08-03 MED ORDER — CYCLOBENZAPRINE HCL 5 MG PO TABS: 5.0000 mg | ORAL_TABLET | Freq: Three times a day (TID) | ORAL | 0 refills | Status: AC | PRN

## 2024-08-03 NOTE — Discharge Instructions (Signed)

## 2024-08-03 NOTE — ED Triage Notes (Addendum)
 Spanish Interpretor [place] [facility] [place] # [id number]  Pt presents c/o back pain x 4 weeks. Pt states,  I have pain in my lower back. It's been there for about 4 weeks or so.  Pt denies injury and/or falls.  Interpretor switched after triaged to [employer] - [phone]

## 2024-08-03 NOTE — ED Provider Notes (Signed)
 [facility]    CSN: [id number] Arrival date & time: 08/03/24  1128      History   Chief Complaint No chief complaint on file.   HPI Andrea Caldwell is a 33 y.o. female.   Pt presents today due to 4 weeks of bilateral lumbar back pain. Pt denies heavy lifting, injury, or repetitive motions with back. Pt states that she does go to the gym but denies injury there as well. Pt also denies changes in bowel or bladder habits. Pt states that she attempted to take 500 mg of Tylenol  once without significant relief.   The history is provided by the patient.    Past Medical History:  Diagnosis Date   Headache    Preterm labor    delivered 1 month early with first baby    Patient Active Problem List   Diagnosis Date Noted   Non-English speaking patient 04/30/2017    Past Surgical History:  Procedure Laterality Date   NO PAST SURGERIES      OB History     Gravida  3   Para  2   Term  1   Preterm  1   AB  1   Living  2      SAB  1   IAB      Ectopic      Multiple  0   Live Births  2            Home Medications    Prior to Admission medications  Medication Sig Start Date End Date Taking? Authorizing Provider  amoxicillin (AMOXIL) 500 MG tablet Take 1 tablet by mouth. 04/10/24  Yes [provider]  cyclobenzaprine  (FLEXERIL ) 5 MG tablet Take 1 tablet (5 mg total) by mouth 3 (three) times daily as needed. 08/03/24  Yes Andra Corean BROCKS, PA-C  diclofenac  (VOLTAREN ) 75 MG EC tablet Take 1 tablet (75 mg total) by mouth 2 (two) times daily. 08/03/24  Yes Andra Corean C, PA-C  lidocaine  (XYLOCAINE ) 2 % solution USE AS DIRECTED 15 MLS IN THE MOUTH OR THROAT AS NEEDED FOR PAIN FOR UP TO 5 DAYS. RINSE AND SPIT. 04/08/24  Yes [provider]  acetaminophen  (TYLENOL ) 500 MG tablet Take 2 tablets (1,000 mg total) by mouth every 6 (six) hours as needed. Patient not taking: Reported on 05/05/2024 04/20/24   Wallace Search  A, PA  B Complex-C-Folic Acid (RENAL) 1 MG CAPS Take 1 capsule by mouth.    [provider]  ibuprofen  (ADVIL ) 800 MG tablet Take 1 tablet (800 mg total) by mouth 3 (three) times daily with meals as needed for headache, moderate pain (pain score 4-6) or cramping. Patient not taking: Reported on 05/05/2024 04/20/24   Wallace Search A, PA  ondansetron  (ZOFRAN -ODT) 8 MG disintegrating tablet Take 1 tablet (8 mg total) by mouth every 8 (eight) hours as needed for nausea or vomiting. Patient not taking: Reported on 05/05/2024 04/20/24   Wallace Search A, PA  oxyCODONE  (OXY IR/ROXICODONE ) 5 MG immediate release tablet Take 1 tablet (5 mg total) by mouth every 4 (four) hours as needed for severe pain (pain score 7-10) or breakthrough pain. Patient not taking: Reported on 05/05/2024 04/20/24   Wallace Search LABOR, PA    Family History Family History  Problem Relation Age of Onset   Cancer Mother    Hyperlipidemia Father    Heart disease Father    Diabetes Maternal Grandmother    Kidney disease Maternal Aunt  Diabetes Paternal Uncle     Social History Social History[1]   Allergies   Patient has no known allergies.   Review of Systems Review of Systems   Physical Exam Triage Vital Signs ED Triage Vitals  Encounter Vitals Group     BP 08/03/24 1253 125/74     Girls Systolic BP Percentile --      Girls Diastolic BP Percentile --      Boys Systolic BP Percentile --      Boys Diastolic BP Percentile --      Pulse Rate 08/03/24 1253 80     Resp 08/03/24 1253 16     Temp 08/03/24 1253 98.3 F (36.8 C)     Temp Source 08/03/24 1253 Oral     SpO2 08/03/24 1253 98 %     Weight 08/03/24 1252 140 lb 10.5 oz (63.8 kg)     Height --      Head Circumference --      Peak Flow --      Pain Score 08/03/24 1250 5     Pain Loc --      Pain Education --      Exclude from Growth Chart --    No data found.  Updated Vital Signs BP 125/74 (BP Location: Right Arm)   Pulse 80   Temp 98.3  F (36.8 C) (Oral)   Resp 16   Wt 140 lb 10.5 oz (63.8 kg)   LMP 07/27/2024 (Exact Date)   SpO2 98%   Breastfeeding No   BMI 22.03 kg/m   Visual Acuity Right Eye Distance:   Left Eye Distance:   Bilateral Distance:    Right Eye Near:   Left Eye Near:    Bilateral Near:     Physical Exam Vitals and nursing note reviewed.  Constitutional:      General: She is not in acute distress.    Appearance: Normal appearance. She is not ill-appearing, toxic-appearing or diaphoretic.  Eyes:     General: No scleral icterus. Cardiovascular:     Rate and Rhythm: Normal rate and regular rhythm.     Heart sounds: Normal heart sounds.  Pulmonary:     Effort: Pulmonary effort is normal. No respiratory distress.     Breath sounds: No wheezing or rhonchi.  Musculoskeletal:     Comments: Full ROM, pain elicited with active ROM (forward flexion, extension, and lateral flexion)  Skin:    General: Skin is warm.  Neurological:     Mental Status: She is alert and oriented to person, place, and time.  Psychiatric:        Mood and Affect: Mood normal.        Behavior: Behavior normal.      UC Treatments / Results  Labs (all labs ordered are listed, but only abnormal results are displayed) Labs Reviewed  POCT URINE DIPSTICK - Abnormal; Notable for the following components:      Result Value   Clarity, UA cloudy (*)    Ketones, POC UA trace (5) (*)    All other components within normal limits    EKG   Radiology No results found.  Procedures Procedures (including critical care time)  Medications Ordered in UC Medications - No data to display  Initial Impression / Assessment and Plan / UC Course  I have reviewed the triage vital signs and the nursing notes.  Pertinent labs & imaging results that were available during my care of the patient were reviewed by me  and considered in my medical decision making (see chart for details).    Final Clinical Impressions(s) / UC Diagnoses    Final diagnoses:  Acute low back pain, unspecified back pain laterality, unspecified whether sciatica present  Strain of lumbar spine, initial encounter     Discharge Instructions      Today you have been diagnosed with a musculoskeletal injury.  You should use ice on affected area for 20 minutes at a time a couple times a day for the first 24 hours then you may switch to heat in the same intervals.  Be sure to put a barrier between ice or heat source and skin to prevent burns.  May also wrap affected area and Ace bandage if tolerated and appropriate, and elevate above the level of the heart to help reduce swelling.  Do not wrap Ace bandages around neck or torso as wrapping too tight can restrict air movement inability to breathe.  If symptoms do not seem to be improving in 3 to 5 days after following these instructions we need to follow-up with orthopedist or PCP.     ED Prescriptions     Medication Sig Dispense Auth. Provider   diclofenac  (VOLTAREN ) 75 MG EC tablet Take 1 tablet (75 mg total) by mouth 2 (two) times daily. 30 tablet Andra Krabbe C, PA-C   cyclobenzaprine  (FLEXERIL ) 5 MG tablet Take 1 tablet (5 mg total) by mouth 3 (three) times daily as needed. 30 tablet Andra Krabbe BROCKS, PA-C      PDMP not reviewed this encounter.    [1]  Social History Tobacco Use   Smoking status: Never    Passive exposure: Never   Smokeless tobacco: Never  Vaping Use   Vaping status: Never Used  Substance Use Topics   Alcohol use: No   Drug use: No     Andra Krabbe BROCKS, PA-C 08/03/24 1353
